# Patient Record
Sex: Male | Born: 2000 | Hispanic: No | Marital: Single | Smoking: Current every day smoker
Health system: Southern US, Community
[De-identification: ages and names within clinical notes are randomized; demographics above are authoritative.]

## PROBLEM LIST (undated history)

## (undated) DIAGNOSIS — R6250 Unspecified lack of expected normal physiological development in childhood: Secondary | ICD-10-CM

## (undated) DIAGNOSIS — R278 Other lack of coordination: Secondary | ICD-10-CM

## (undated) DIAGNOSIS — E119 Type 2 diabetes mellitus without complications: Secondary | ICD-10-CM

## (undated) DIAGNOSIS — Z9889 Other specified postprocedural states: Secondary | ICD-10-CM

## (undated) DIAGNOSIS — F840 Autistic disorder: Secondary | ICD-10-CM

## (undated) DIAGNOSIS — F909 Attention-deficit hyperactivity disorder, unspecified type: Secondary | ICD-10-CM

## (undated) DIAGNOSIS — F802 Mixed receptive-expressive language disorder: Secondary | ICD-10-CM

## (undated) HISTORY — DX: Type 2 diabetes mellitus without complications: E11.9

## (undated) HISTORY — DX: Autistic disorder: F84.0

## (undated) HISTORY — DX: Mixed receptive-expressive language disorder: F80.2

## (undated) HISTORY — DX: Other lack of coordination: R27.8

## (undated) HISTORY — PX: TONSILLECTOMY AND ADENOIDECTOMY: SUR1326

## (undated) HISTORY — PX: OTHER SURGICAL HISTORY: SHX169

## (undated) HISTORY — DX: Attention-deficit hyperactivity disorder, unspecified type: F90.9

## (undated) HISTORY — DX: Unspecified lack of expected normal physiological development in childhood: R62.50

## (undated) HISTORY — DX: Other specified postprocedural states: Z98.890

---
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
## 2021-02-03 ENCOUNTER — Encounter (HOSPITAL_COMMUNITY): Payer: Self-pay | Admitting: Physician Assistant

## 2021-03-19 ENCOUNTER — Ambulatory Visit (INDEPENDENT_AMBULATORY_CARE_PROVIDER_SITE_OTHER): Payer: Medicaid Other | Admitting: Physician Assistant

## 2021-03-19 ENCOUNTER — Encounter (HOSPITAL_COMMUNITY): Payer: Self-pay | Admitting: Physician Assistant

## 2021-03-19 ENCOUNTER — Other Ambulatory Visit: Payer: Self-pay

## 2021-03-19 VITALS — BP 112/71 | HR 75 | Ht 68.0 in | Wt 135.0 lb

## 2021-03-19 DIAGNOSIS — F909 Attention-deficit hyperactivity disorder, unspecified type: Secondary | ICD-10-CM

## 2021-03-19 DIAGNOSIS — F339 Major depressive disorder, recurrent, unspecified: Secondary | ICD-10-CM

## 2021-03-19 DIAGNOSIS — F840 Autistic disorder: Secondary | ICD-10-CM | POA: Diagnosis not present

## 2021-03-19 DIAGNOSIS — F411 Generalized anxiety disorder: Secondary | ICD-10-CM | POA: Diagnosis not present

## 2021-03-19 MED ORDER — ATOMOXETINE HCL 25 MG PO CAPS: 25.0000 mg | ORAL_CAPSULE | Freq: Every day | ORAL | 1 refills | Status: DC

## 2021-03-19 NOTE — Progress Notes (Addendum)
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  Patient is currently not taking any medications at this time.  Patient has been on numerous medications for the management of his mood swings and high levels of anxiety.  Although patient's speech is minimal at times, he is able to answer most questions directed towards him.  Patient states that due to his high levels of anxiety, he is constantly silent and depressed.  Patient endorses the following depressive symptoms: low mood, lack of interest in activities or hobbies, irritability, worthlessness, and hopelessness.  He denies instances of crying spells and exhaustion.  When patient was asked how he was doing, patient stated that knowing that he needs to survive in this fear of death keeps him going.  He reports that he is trying to figure out how to get through life and achieve his goals.  Patient states that he has aspirations to become a Marine scientist as well as learn how to play IT sales professional.  Patient states that he is trying to build strength and wants to learn how to not care about things while thinking tactically.  A current stressor in the patient's life involves managing his diabetes.  Patient states that it is hard for him to eat healthfully and often consumes chocolate to make himself feel better.  A PHQ-9 screen and GAD-7 screen were unable to be performed due to patient refusal.  Patient is alert and oriented x4, calm, cooperative, and engaged in conversation during  the encounter.  Patient reports that his body constantly switches from anxious to depressed depending on the situation.  Patient denies suicidal or homicidal ideations.  He further denies auditory or visual hallucinations and does not appear to be responding to internal/external stimuli.  Patient expresses that his sleep is fine.  Patient endorses fluctuating appetite due to his type 1 diabetes diagnosis.  Patient denies alcohol consumption, tobacco use, and illicit drug use.  Visit Diagnosis:    ICD-10-CM   1. Autism spectrum disorder  F84.0     2. Episode of recurrent major depressive disorder, unspecified depression episode severity (HCC)  F33.9     3. Anxiety state  F41.1     4. Attention deficit hyperactivity disorder (ADHD), unspecified ADHD type  F90.9 atomoxetine (STRATTERA) 25 MG capsule      Past Psychiatric History:  Anxiety Depression ADHD PDD NOS (Pervasive Developmental Disorder-Not Otherwise Specified) Autism spectrum disorder  Past Medical History:  Past Medical History:  Diagnosis Date   ADHD (attention deficit hyperactivity disorder)    Autism spectrum disorder    Developmental delay    Diabetes mellitus without complication (HCC)    Type I   Dysgraphia    History of tympanoplasty of right ear    [facility]   Receptive-expressive language delay    No past surgical history on file.  Family Psychiatric History:  Mother - ADD + auditory processing disorder. Per mother, bipolar disorder and depression run in her family. Father - Schizophrenia Uncle - Schizophrenia Grandmother (paternal) - Schizophrenia  Family History:  Family History  Problem Relation Age of  Onset   ADD / ADHD Mother    Crohn's disease Mother    Schizophrenia Father     Social History:  Social History   Socioeconomic History   Marital status: Single    Spouse name: Not on file   Number of children: Not on file   Years of education: Not on file   Highest education level: Not on  file  Occupational History   Not on file  Tobacco Use   Smoking status: Never   Smokeless tobacco: Never  Substance and Sexual Activity   Alcohol use: No   Drug use: No   Sexual activity: Never  Other Topics Concern   Not on file  Social History Narrative   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  Participates in taekwondo   Social Determinants of Health   Financial Resource Strain: Not on file  Food Insecurity: Not on file  Transportation Needs: Not on file  Physical Activity: Not on file  Stress: Not on file  Social Connections: Not on file    Allergies:  Allergies  Allergen Reactions   Amoxicillin Rash    Did it involve swelling of the face/tongue/throat, SOB, or low BP? No Did it involve sudden or severe rash/hives, skin peeling, or any reaction on the inside of your mouth or nose? No Did you need to seek medical attention at a hospital or doctor's office? Yes When did it last happen?     pt was a baby  If all above answers are "NO", may proceed with cephalosporin use.     Metabolic Disorder Labs: Lab Results  Component Value Date   HGBA1C 6.5 (H) 10/23/2019   MPG 140 10/23/2019   MPG 105 12/25/2015   No results found for: PROLACTIN No results found for: CHOL, TRIG, HDL, CHOLHDL, VLDL, LDLCALC Lab Results  Component Value Date   TSH 2.78 01/27/2017   TSH 5.04 (H) 01/06/2017    Therapeutic Level Labs: No results found for: LITHIUM No results found for: VALPROATE No components found for:  CBMZ  Current Medications: Current Outpatient Medications  Medication Sig Dispense Refill   atomoxetine (STRATTERA) 25 MG capsule Take 1 capsule (25 mg total) by mouth daily. 30 capsule 1   clindamycin (CLEOCIN) 150 MG capsule Take 1 capsule (150 mg total) by mouth 3 (three) times daily. 30 capsule 0   hydrOXYzine (ATARAX/VISTARIL) 25 MG tablet Take 0.5-1 tablets (12.5-25 mg total) by mouth every 8 (eight) hours as needed for itching. 30 tablet 0   insulin aspart (NOVOLOG) 100  UNIT/ML FlexPen Inject 1-8 Units into the skin 3 (three) times daily with meals. Divides carbs by 8 to get units     insulin glargine (LANTUS) 100 UNIT/ML Solostar Pen Inject 19 Units into the skin daily.     triamcinolone cream (KENALOG) 0.1 % Apply 1 application topically 2 (two) times daily. 30 g 0   No current facility-administered medications for this visit.     Musculoskeletal: Strength & Muscle Tone: within normal limits Gait & Station: normal Patient leans: N/A  Psychiatric Specialty Exam: Review of Systems  Psychiatric/Behavioral:  Positive for behavioral problems and decreased concentration. Negative for dysphoric mood, hallucinations, self-injury, sleep disturbance and suicidal ideas. The patient is nervous/anxious. The patient is not hyperactive.    Blood pressure 112/71, pulse 75, height 5\' 8"  (1.727 m), weight 135 lb (61.2 kg).Body mass index is 20.53 kg/m.  General Appearance: Bizarre and Well Groomed  Eye Contact:  Fair  Speech:  Clear and Coherent and Slow  Volume:  Normal  Mood:  Anxious, Depressed, and Worthless  Affect:  Congruent, Depressed, and Restricted  Thought Process:  Coherent, Goal Directed, and Descriptions of Associations: Intact  Orientation:  Full (Time, Place, and Person)  Thought Content: WDL and Obsessions   Suicidal Thoughts:  No  Homicidal Thoughts:  No  Memory:  Immediate;   Fair Recent;   Fair Remote;   Fair  Judgement:  Fair  Insight:  Present  Psychomotor Activity:  Decreased  Concentration:  Concentration: Fair and Attention Span: Fair  Recall:  [employer] of Knowledge: Fair  Language: Fair  Akathisia:  NA  Handed:  Left  AIMS (if indicated): not done  Assets:  Desire for Improvement Housing Social Support  ADL's:  Intact  Cognition: Impaired,  Mild  Sleep:  Fair   Screenings: AIMS    Flowsheet Row [facility]  AIMS Total Score 0      PHQ2-9     Flowsheet Row Office Visit from 08/26/2020 in [facility]  PHQ-2 Total Score 2  PHQ-9 Total Score 8      Flowsheet Row Office Visit from 03/19/2021 in [facility] ED from 12/13/2020 in [facility] Visit from 12/09/2020 in [facility]  C-SSRS RISK CATEGORY Low Risk No Risk Low Risk        Assessment and Plan:   Jeffrey Delgado is a 20 year old male with a past psychiatric history significant for anxiety, major depressive disorder, attention deficit hyperactivity disorder, and 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  Patient expresses that he is constantly dealing with anxety and depression.  It appears that patient's depression is influenced by his high levels of anxiety.  Patient states that although he is anxious at times, he states that his anxiety keeps him from being reckless.  Patient is interested in being placed on medication to help with his focus and concentration to be able to get his tasks done.  Patient was recommended Strattera 25 mg daily for the management of his lack of focus and concentration.  Patient was agreeable to recommendation.  Patient's medication to be prescribed to pharmacy of choice.  1. Autism spectrum disorder   2. Episode of recurrent major depressive disorder, unspecified depression episode severity (HCC)   3. Anxiety state   4. Attention deficit hyperactivity disorder (ADHD), unspecified ADHD type  - atomoxetine (STRATTERA) 25 MG capsule; Take 1 capsule (25 mg total) by mouth daily.  Dispense: 30 capsule; Refill: 1  Provider to follow up in 6 week Provider spent a total of 25 minutes with the patient/reviewing the patient's chart  Meta Hatchet, PA 03/19/2021, 2:02 PM

## 2021-05-13 ENCOUNTER — Encounter (HOSPITAL_COMMUNITY): Payer: Medicaid Other | Admitting: Physician Assistant

## 2021-05-19 ENCOUNTER — Ambulatory Visit: Payer: Medicaid Other | Admitting: Family Medicine

## 2021-05-25 ENCOUNTER — Encounter: Payer: Self-pay | Admitting: Family Medicine

## 2021-05-25 ENCOUNTER — Ambulatory Visit (INDEPENDENT_AMBULATORY_CARE_PROVIDER_SITE_OTHER): Payer: Medicaid Other | Admitting: Family Medicine

## 2021-05-25 ENCOUNTER — Other Ambulatory Visit: Payer: Self-pay

## 2021-05-25 VITALS — BP 132/78 | HR 67 | Resp 18 | Ht 68.0 in | Wt 131.0 lb

## 2021-05-25 DIAGNOSIS — E1069 Type 1 diabetes mellitus with other specified complication: Secondary | ICD-10-CM

## 2021-05-25 DIAGNOSIS — R634 Abnormal weight loss: Secondary | ICD-10-CM

## 2021-05-25 NOTE — Progress Notes (Signed)
Subjective:    Patient ID: Jeffrey Delgado, male    DOB: 2001-03-31, 20 y.o.   MRN: [id number]  HPI  Patient is a 20 year old gentleman who presents today for failure to gain weight.  He has a history of insulin-dependent type 1 diabetes mellitus.  He is currently on 18 units of Lantus daily and then rapid acting insulin 3 times a day depending upon his sugars.  He states that he has not seen endocrinologist in quite some time however he reports that his sugars are typically running in the 150 range.  Therefore hyperglycemia does not seem to be the cause of his weight loss.  He denies any polyuria or polydipsia.  The appointment was originally scheduled for bleeding per rectum however the patient adamantly denies that he is having any blood in his stool.  He states that his mother made that appointment and that she is "paranoid".  He again denies any blood in the stool.  He denies any abdominal pain.  He denies any diarrhea.  He denies any fevers or chills.  He denies any chest pain or cough or shortness of breath.  He denies any nausea or vomiting.  He states that the endocrinologist has not checked his thyroid because he has not seen them.  He denies any palpitations or tachycardia.  He denies any nausea or vomiting when eating gluten rich foods.  He denies any diarrhea when eating gluten rich foods. Past Medical History:  Diagnosis Date   ADHD (attention deficit hyperactivity disorder)    Autism spectrum disorder    Developmental delay    Diabetes mellitus without complication (HCC)    Type I   Dysgraphia    History of tympanoplasty of right ear    [facility]   Receptive-expressive language delay    History reviewed. No pertinent surgical history. Current Outpatient Medications on File Prior to Visit  Medication Sig Dispense Refill   Continuous Blood Gluc Sensor (DEXCOM G6 SENSOR) MISC SMARTSIG:1 Topical Every 10 Days     insulin aspart (NOVOLOG) 100 UNIT/ML FlexPen Inject 1-8 Units  into the skin 3 (three) times daily with meals. Divides carbs by 8 to get units     insulin glargine (LANTUS) 100 UNIT/ML Solostar Pen Inject 19 Units into the skin daily.     atomoxetine (STRATTERA) 25 MG capsule Take 1 capsule (25 mg total) by mouth daily. (Patient not taking: Reported on 05/25/2021) 30 capsule 1   hydrOXYzine (ATARAX/VISTARIL) 25 MG tablet Take 0.5-1 tablets (12.5-25 mg total) by mouth every 8 (eight) hours as needed for itching. (Patient not taking: Reported on 05/25/2021) 30 tablet 0   triamcinolone cream (KENALOG) 0.1 % Apply 1 application topically 2 (two) times daily. (Patient not taking: Reported on 05/25/2021) 30 g 0   No current facility-administered medications on file prior to visit.   Allergies  Allergen Reactions   Amoxicillin Rash    Did it involve swelling of the face/tongue/throat, SOB, or low BP? No Did it involve sudden or severe rash/hives, skin peeling, or any reaction on the inside of your mouth or nose? No Did you need to seek medical attention at a hospital or doctor's office? Yes When did it last happen?     pt was a baby  If all above answers are NO, may proceed with cephalosporin use.    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     Objective:   Physical Exam Constitutional:      General: He is not in acute distress.    Appearance: He is not ill-appearing or toxic-appearing.  Cardiovascular:     Rate and Rhythm: Normal rate and regular rhythm.     Pulses: Normal pulses.     Heart sounds: Normal heart sounds. No murmur heard.   No friction rub. No gallop.  Pulmonary:     Effort: Pulmonary effort is normal. No respiratory distress.     Breath sounds: Normal breath sounds. No stridor. No wheezing, rhonchi or rales.  Abdominal:     General: Abdomen is flat. Bowel sounds are normal. There is no distension.     Palpations: Abdomen is soft.     Tenderness: There is no abdominal tenderness. There is no guarding.  Musculoskeletal:     Right lower leg: No edema.     Left lower leg: No edema.  Neurological:     Mental Status: He is alert.  Psychiatric:        Attention and Perception: Attention normal.        Mood and Affect: Affect is flat.        Speech: Speech is delayed.        Behavior: Behavior normal.    Since I last saw the patient, he has several tattoos including a facial tattoo as well as a piercing above his left eyebrow.  He has also grown  mowhawk like hairstyle.  Include only for context about possible underlying illness.      Assessment & Plan:  Type 1 diabetes mellitus with other specified complication (HCC) - Plan: CBC with Differential/Platelet, COMPLETE METABOLIC PANEL WITH GFR, TSH, Hemoglobin A1c, Sedimentation rate, Celiac panel 10  Weight loss - Plan: CBC with Differential/Platelet, COMPLETE METABOLIC PANEL WITH GFR, TSH, Hemoglobin A1c, Sedimentation rate, Celiac panel 10 Given his history of type 1 diabetes mellitus, I feel that we need to check for hypothyroidism with a TSH.  I would also check a celiac panel to evaluate for celiac disease and check a sedimentation rate to evaluate for any  other autoimmune diseases although he denies any diarrhea or hematochezia.  Check a CBC and a CMP to look for any underlying metabolic abnormalities.  If these labs are normal, consult GI about possible pancreatic insufficiency given his underlying history of type 1 diabetes mellitus.  However may also be due to underlying emotional issues. Wt Readings from Last 3 Encounters:  05/25/21 131 lb (59.4 kg) (14 %, Z= -1.06)*  12/13/20 141 lb 12.8 oz (64.3 kg) (33 %, Z= -0.43)*  09/25/20 142 lb (64.4  kg) (35 %, Z= -0.38)*   * Growth percentiles are based on CDC (Boys, 2-20 Years) data.

## 2021-05-26 ENCOUNTER — Telehealth: Payer: Self-pay

## 2021-05-26 LAB — COMPLETE METABOLIC PANEL WITH GFR
AG Ratio: 1.7 (calc) (ref 1.0–2.5)
ALT: 11 U/L (ref 8–46)
AST: 13 U/L (ref 12–32)
Albumin: 4.6 g/dL (ref 3.6–5.1)
Alkaline phosphatase (APISO): 80 U/L (ref 46–169)
BUN: 16 mg/dL (ref 7–20)
CO2: 24 mmol/L (ref 20–32)
Calcium: 9.8 mg/dL (ref 8.9–10.4)
Chloride: 102 mmol/L (ref 98–110)
Creat: 0.78 mg/dL (ref 0.60–1.24)
Globulin: 2.7 g/dL (calc) (ref 2.1–3.5)
Glucose, Bld: 243 mg/dL — ABNORMAL HIGH (ref 65–99)
Potassium: 4.5 mmol/L (ref 3.8–5.1)
Sodium: 137 mmol/L (ref 135–146)
Total Bilirubin: 0.8 mg/dL (ref 0.2–1.1)
Total Protein: 7.3 g/dL (ref 6.3–8.2)
eGFR: 132 mL/min/{1.73_m2} (ref >=60)

## 2021-05-26 LAB — CBC WITH DIFFERENTIAL/PLATELET
Absolute Monocytes: 491 cells/uL (ref 200–950)
Basophils Absolute: 38 cells/uL (ref 0–200)
Basophils Relative: 0.6 %
Eosinophils Absolute: 50 cells/uL (ref 15–500)
Eosinophils Relative: 0.8 %
HCT: 50.3 % — ABNORMAL HIGH (ref 38.5–50.0)
Hemoglobin: 16.4 g/dL (ref 13.2–17.1)
Lymphs Abs: 1443 cells/uL (ref 850–3900)
MCH: 30.8 pg (ref 27.0–33.0)
MCHC: 32.6 g/dL (ref 32.0–36.0)
MCV: 94.4 fL (ref 80.0–100.0)
MPV: 11.7 fL (ref 7.5–12.5)
Monocytes Relative: 7.8 %
Neutro Abs: 4278 cells/uL (ref 1500–7800)
Neutrophils Relative %: 67.9 %
Platelets: 243 10*3/uL (ref 140–400)
RBC: 5.33 10*6/uL (ref 4.20–5.80)
RDW: 11.3 % (ref 11.0–15.0)
Total Lymphocyte: 22.9 %
WBC: 6.3 10*3/uL (ref 3.8–10.8)

## 2021-05-26 LAB — HEMOGLOBIN A1C
Hgb A1c MFr Bld: 6.1 % of total Hgb — ABNORMAL HIGH (ref <5.7)
Mean Plasma Glucose: 128 mg/dL
eAG (mmol/L): 7.1 mmol/L

## 2021-05-26 LAB — TSH: TSH: 3.94 mIU/L (ref 0.50–4.30)

## 2021-05-26 LAB — SEDIMENTATION RATE: Sed Rate: 2 mm/h (ref 0–15)

## 2021-05-26 NOTE — Telephone Encounter (Signed)
Attempted to reach patient but no answer and voicemail not set up.

## 2021-05-26 NOTE — Telephone Encounter (Signed)
-----   Message from Donita Brooks, MD sent at 05/26/2021  6:38 AM EST ----- Labs so far are normal.  Diabetes test actually looks pretty good.  Thyroid test is normal.  Sed rate is normal suggesting against inflammatory bowel disease.  Awaiting the results of the celiac panel.  If this is normal may consult GI regarding pancreatic insufficiency.

## 2021-06-01 ENCOUNTER — Encounter: Payer: Self-pay | Admitting: Family Medicine

## 2021-06-01 DIAGNOSIS — R899 Unspecified abnormal finding in specimens from other organs, systems and tissues: Secondary | ICD-10-CM

## 2021-06-05 ENCOUNTER — Encounter: Payer: Self-pay | Admitting: Gastroenterology

## 2021-06-09 ENCOUNTER — Ambulatory Visit (INDEPENDENT_AMBULATORY_CARE_PROVIDER_SITE_OTHER): Payer: Medicaid Other | Admitting: Physician Assistant

## 2021-06-09 ENCOUNTER — Encounter (HOSPITAL_COMMUNITY): Payer: Self-pay | Admitting: Physician Assistant

## 2021-06-09 VITALS — BP 127/72 | HR 70 | Ht 66.0 in | Wt 140.0 lb

## 2021-06-09 DIAGNOSIS — F411 Generalized anxiety disorder: Secondary | ICD-10-CM

## 2021-06-09 DIAGNOSIS — F840 Autistic disorder: Secondary | ICD-10-CM | POA: Diagnosis not present

## 2021-06-09 DIAGNOSIS — F339 Major depressive disorder, recurrent, unspecified: Secondary | ICD-10-CM | POA: Diagnosis not present

## 2021-06-09 DIAGNOSIS — F909 Attention-deficit hyperactivity disorder, unspecified type: Secondary | ICD-10-CM | POA: Diagnosis not present

## 2021-06-09 NOTE — Progress Notes (Signed)
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  Following the conclusion of his last encounter, patient was placed on Strattera 25 mg daily for the management of his lack of focus and concentration.   Patient reports that he is no longer taking his Strattera due to experiencing heavy side effects.  The main side effect that the patient experienced while on Strattera was drowsiness and states that he was unable to complete his work due to his sleepiness.  Patient is unable to accurately define his current mood but was able to endorse depression and anxiety.  He reports that his emotions are repressed and is unsure of he is actively repressing his own emotions.  He later adds that he is unsure if it is a good idea for his emotions to be repressed.  Patient is unable to scale/grade his anxiety at this time.  When asked about stressors, patient responded with "I wouldn't know."  He does express that he experiences agitation due to constantly realizing that he has one life to live.  He adds that his agitation is attributed to the need to enhance his body and make it more superior.  He states that he does not want to be inferior and hates the flaws in his genetics.  He reports despising people who live in their lives in circles doing the same routine over and over.  He reports wanting to live in a world of fantasy such as worlds he visits in his sleep.  Patient is unable to complete a PHQ-9 screen or a GAD-7 screen due to being unable to accurately grade his symptoms.  Patient  is calm, cooperative, and engaged in conversation during the encounter.  Patient continues to endorse that he is unsure of his mood.  Patient denies suicidal or homicidal ideations.  He further denies auditory or visual hallucinations and does not appear to be responding to internal/external stimuli.  Although he does appear to be exhibiting some imaginative/colorful thought content, he does not exhibit any bizarre behavior.  Patient endorses fair sleep receiving anywhere between 6 and 12 hours.  He states that he occasionally engages in micro naps receives between 1 to 2 hours of sleep at a time.  Patient endorses fair appetite stating that he is currently participating in a carnivore diet.  Patient states that he does meal prep and will drink protein shakes routinely.  Patient denies alcohol consumption, tobacco use, and illicit drug use.  Visit Diagnosis:    ICD-10-CM   1. Autism spectrum disorder  F84.0     2. Episode of recurrent major depressive disorder, unspecified depression episode severity ([place])  F33.9     3. Anxiety state  F41.1     4. Attention deficit hyperactivity disorder (ADHD), unspecified ADHD type  F90.9       Past Psychiatric History:  Anxiety Depression ADHD PDD NOS (Pervasive Developmental Disorder-Not Otherwise Specified) Autism spectrum disorder  Past Medical History:  Past Medical History:  Diagnosis Date   ADHD (attention deficit hyperactivity disorder)    Autism spectrum disorder    Developmental delay    Diabetes mellitus without complication ([place])  Type I   Dysgraphia    History of tympanoplasty of right ear    [facility]   Receptive-expressive language delay    History reviewed. No pertinent surgical history.  Family Psychiatric History:  Mother - ADD + auditory processing disorder. Per mother, bipolar disorder and depression run in her family. Father - Schizophrenia Uncle - Schizophrenia Grandmother (paternal) - Schizophrenia  Family  History:  Family History  Problem Relation Age of Onset   ADD / ADHD Mother    Crohn's disease Mother    Schizophrenia Father     Social History:  Social History   Socioeconomic History   Marital status: Single    Spouse name: Not on file   Number of children: Not on file   Years of education: Not on file   Highest education level: Not on file  Occupational History   Not on file  Tobacco Use   Smoking status: Never   Smokeless tobacco: Never  Substance and Sexual Activity   Alcohol use: No   Drug use: No   Sexual activity: Never  Other Topics Concern   Not on file  Social History Narrative   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 No Did it involve sudden or severe rash/hives, skin peeling, or any reaction on the inside of your mouth or nose? No Did you need to seek medical attention at a hospital or doctor's office? Yes When did it last happen?     pt was a baby  If all above answers are NO, may proceed with cephalosporin use.     Metabolic Disorder Labs: Lab Results  Component Value Date   HGBA1C 6.1 (H) 05/25/2021   MPG 128 05/25/2021   MPG 140 10/23/2019   No results found for: PROLACTIN No results found for: CHOL, TRIG, HDL, CHOLHDL, VLDL, LDLCALC Lab Results  Component Value Date   TSH 3.94 05/25/2021   TSH 2.78 01/27/2017    Therapeutic Level Labs: No results found for: LITHIUM No results found for: VALPROATE No components found for:  CBMZ  Current Medications: Current Outpatient Medications  Medication Sig Dispense Refill   atomoxetine (STRATTERA) 25 MG capsule Take  1 capsule (25 mg total) by mouth daily. (Patient not taking: Reported on 05/25/2021) 30 capsule 1   Continuous Blood Gluc Sensor (DEXCOM G6 SENSOR) MISC SMARTSIG:1 Topical Every 10 Days     hydrOXYzine (ATARAX/VISTARIL) 25 MG tablet Take 0.5-1 tablets (12.5-25 mg total) by mouth every 8 (eight) hours as needed for itching. (Patient not taking: Reported on 05/25/2021) 30 tablet 0   insulin aspart (NOVOLOG) 100 UNIT/ML FlexPen Inject 1-8 Units into the skin 3 (three) times daily with meals. Divides carbs by 8 to get units     insulin glargine (LANTUS) 100 UNIT/ML Solostar Pen Inject 19 Units into the skin daily.     triamcinolone cream (KENALOG) 0.1 % Apply 1 application topically 2 (two) times daily. (Patient not taking: Reported on 05/25/2021) 30 g 0   No current facility-administered medications for this visit.     Musculoskeletal: Strength & Muscle Tone: within normal  limits Gait & Station: normal Patient leans: N/A  Psychiatric Specialty Exam: Review of Systems  Psychiatric/Behavioral:  Positive for behavioral problems, decreased concentration, dysphoric mood and sleep disturbance. Negative for hallucinations, self-injury and suicidal ideas. The patient is nervous/anxious. The patient is not hyperactive.    Blood pressure 127/72, pulse 70, height 5\' 6"  (1.676 m), weight 140 lb (63.5 kg), SpO2 98 %.Body mass index is 22.6 kg/m.  General Appearance: Bizarre and Casual  Eye Contact:  Fair  Speech:  Clear and Coherent and Slow  Volume:  Normal  Mood:  Anxious, Depressed, and Worthless  Affect:  Congruent, Depressed, and Restricted  Thought Process:  Coherent and Descriptions of Associations: Intact  Orientation:  Full (Time, Place, and Person)  Thought Content: WDL and Obsessions   Suicidal Thoughts:  No  Homicidal Thoughts:  No  Memory:  Immediate;   Fair Recent;   Fair Remote;   Fair  Judgement:  Fair  Insight:  Present  Psychomotor Activity:  Decreased  Concentration:   Concentration: Fair and Attention Span: Fair  Recall:  [employer] of Knowledge: Fair  Language: Fair  Akathisia:  NA  Handed:  Left  AIMS (if indicated): not done  Assets:  Communication Skills Desire for Improvement Social Support  ADL's:  Intact  Cognition: Impaired,  Mild  Sleep:  Fair   Screenings: AIMS    Flowsheet Row Admission (Discharged) from 10/22/2019 in [place] [facility]  AIMS Total Score 0      PHQ2-9    [place] Office Visit from 08/26/2020 in [place]  PHQ-2 Total Score 2  PHQ-9 Total Score 8      [place] Visit from 06/09/2021 in 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  Patient discontinued taking his Strattera due to experiencing side effects from the medication.  Patient was recommended Genesight testing determine what medications may be helpful for his symptoms based off his genetics.  Patient was agreeable to participating in testing.  Provider informed patient that his results will be discussed with him following his next appointment.  1. Autism spectrum disorder   2. Episode of recurrent major depressive disorder, unspecified depression episode severity ([place])   3. Anxiety state   4. Attention deficit hyperactivity disorder (ADHD), unspecified ADHD type  Patient to follow up in 2 months Provider spent a total of 21 minutes with the  patient/reviewing patient's chart   Malachy Mood, PA 06/09/2021, 6:37 PM

## 2021-06-10 ENCOUNTER — Ambulatory Visit: Payer: Medicaid Other | Admitting: Podiatry

## 2021-06-17 ENCOUNTER — Other Ambulatory Visit (HOSPITAL_COMMUNITY): Payer: Self-pay | Admitting: Physician Assistant

## 2021-06-17 DIAGNOSIS — F909 Attention-deficit hyperactivity disorder, unspecified type: Secondary | ICD-10-CM

## 2021-06-29 ENCOUNTER — Encounter: Payer: Self-pay | Admitting: Gastroenterology

## 2021-06-29 ENCOUNTER — Other Ambulatory Visit: Payer: Medicaid Other

## 2021-06-29 ENCOUNTER — Ambulatory Visit (INDEPENDENT_AMBULATORY_CARE_PROVIDER_SITE_OTHER): Payer: Medicaid Other | Admitting: Gastroenterology

## 2021-06-29 VITALS — BP 110/80 | HR 90 | Ht 66.0 in | Wt 136.0 lb

## 2021-06-29 DIAGNOSIS — R634 Abnormal weight loss: Secondary | ICD-10-CM | POA: Diagnosis not present

## 2021-06-29 DIAGNOSIS — R197 Diarrhea, unspecified: Secondary | ICD-10-CM | POA: Diagnosis not present

## 2021-06-29 NOTE — Patient Instructions (Signed)
If you are age 20 or older, your body mass index should be between 23-30. Your Body mass index is 21.95 kg/m. If this is out of the aforementioned range listed, please consider follow up with your Primary Care Provider.  If you are age 23 or younger, your body mass index should be between 19-25. Your Body mass index is 21.95 kg/m. If this is out of the aformentioned range listed, please consider follow up with your Primary Care Provider.   ________________________________________________________  The [facility] providers would like to encourage you to use [facility] to communicate with providers for non-urgent requests or questions.  Due to long hold times on the telephone, sending your provider a message by [facility] may be a faster and more efficient way to get a response.  Please allow 48 business hours for a response.  Please remember that this is for non-urgent requests.  _______________________________________________________  Your provider has requested that you go to the basement level for lab work before leaving today. Press "B" on the elevator. The lab is located at the first door on the left as you exit the elevator.  Due to recent changes in healthcare laws, you may see the results of your imaging and laboratory studies on MyChart before your provider has had a chance to review them.  We understand that in some cases there may be results that are confusing or concerning to you. Not all laboratory results come back in the same time frame and the provider may be waiting for multiple results in order to interpret others.  Please give [place] 48 hours in order for your provider to thoroughly review all the results before contacting the office for clarification of your results.   It was a pleasure to see you today!  Thank you for trusting me with your gastrointestinal care!

## 2021-06-29 NOTE — Progress Notes (Signed)
[facility] Consult Note:  History: Jeffrey Delgado 06/29/2021  Referring provider: Susy Frizzle, MD  Reason for consult/chief complaint: abnormal labs (Can't put weight on, labs done thru PCP, diabetic)   Subjective  HPI: Jeffrey Delgado was here with his mother today and referred by primary care for weight loss.  Notes from recent primary care visit referenced below. It was difficult to get a clear history due to his communication disorder.  As near as I can determine, there has been occasional abdominal pain, but cannot characterize it any better.  When asked if diarrhea, he first answered no, then said there is occasional loose stool including an episode this morning and perhaps one a few days ago.  He denies blood in the stool, nausea, vomiting or early satiety.  He reports a good appetite but cannot put weight on.  It does not sound like he has any abdominal pain while eating.  There is documented weight loss in PCP records.  Numerous labs done at recent primary care visit, intention was also for celiac labs but they either were not drawn or there was a problem processing them at the lab so no results. Mother reports that she has UC or Crohn's.  Unfortunately, she has not been able to get a clear story from [place] at home from any digestive symptoms he might be having.  (10 min late to appointment) ROS:  Review of Systems  Constitutional:  Negative for appetite change and unexpected weight change.  HENT:  Negative for mouth sores and voice change.   Eyes:  Negative for pain and redness.  Respiratory:  Negative for cough and shortness of breath.   Cardiovascular:  Negative for chest pain and palpitations.  Genitourinary:  Negative for dysuria and hematuria.  Musculoskeletal:  Negative for arthralgias and myalgias.  Skin:  Negative for pallor and rash.  Neurological:  Negative for weakness and headaches.  Hematological:  Negative for adenopathy.    Past  Medical History: Past Medical History:  Diagnosis Date   ADHD (attention deficit hyperactivity disorder)    Autism spectrum disorder    Developmental delay    Diabetes mellitus without complication ([place])    Type I   Dysgraphia    History of tympanoplasty of right ear    [facility]   Receptive-expressive language delay    From 05/25/2021 primary care office note: "Patient is a 21 year old gentleman who presents today for failure to gain weight.  He has a history of insulin-dependent type 1 diabetes mellitus.  He is currently on 18 units of Lantus daily and then rapid acting insulin 3 times a day depending upon his sugars.  He states that he has not seen endocrinologist in quite some time however he reports that his sugars are typically running in the 150 range.  Therefore hyperglycemia does not seem to be the cause of his weight loss.  He denies any polyuria or polydipsia.  The appointment was originally scheduled for bleeding per rectum however the patient adamantly denies that he is having any blood in his stool.  He states that his mother made that appointment and that she is "paranoid".  He again denies any blood in the stool.  He denies any abdominal pain.  He denies any diarrhea.  He denies any fevers or chills.  He denies any chest pain or cough or shortness of breath.  He denies any nausea or vomiting.  He states that the endocrinologist has not checked his thyroid because he  has not seen them.  He denies any palpitations or tachycardia.  He denies any nausea or vomiting when eating gluten rich foods.  He denies any diarrhea when eating gluten rich foods. "   Past Surgical History: Past Surgical History:  Procedure Laterality Date   ingrown toenails Bilateral    TONSILLECTOMY AND ADENOIDECTOMY     tubes in ear     and another ear surgery to fix the hole where the tubes fell out     Family History: Family History  Problem Relation Age of Onset   ADD / ADHD Mother    Crohn's  disease Mother    Schizophrenia Father     Social History: Social History   Socioeconomic History   Marital status: Single    Spouse name: Not on file   Number of children: Not on file   Years of education: Not on file   Highest education level: Not on file  Occupational History   Not on file  Tobacco Use   Smoking status: Never   Smokeless tobacco: Never  Vaping Use   Vaping Use: Never used  Substance and Sexual Activity   Alcohol use: No   Drug use: No   Sexual activity: Never  Other Topics Concern   Not on file  Social History Narrative   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 No Did it involve sudden or severe rash/hives, skin peeling, or any reaction on the inside of your mouth or nose? No Did you need to seek medical attention at a hospital or doctor's office? Yes When did it last happen?     pt was a baby  If all above answers are NO, may proceed with cephalosporin use.     Outpatient Meds: Current Outpatient Medications  Medication Sig Dispense Refill   Continuous Blood Gluc Sensor (DEXCOM G6 SENSOR) MISC SMARTSIG:1 Topical Every 10 Days     hydrOXYzine (ATARAX/VISTARIL) 25 MG tablet Take 0.5-1 tablets (12.5-25 mg total) by mouth every 8 (eight) hours as needed for itching. 30 tablet 0   insulin aspart (NOVOLOG) 100 UNIT/ML FlexPen Inject 1-8 Units into the skin 3 (three) times daily with meals. Divides carbs by 8 to get units     insulin glargine (LANTUS) 100 UNIT/ML Solostar Pen Inject 19 Units into the skin daily.     triamcinolone cream (KENALOG) 0.1 % Apply 1 application  topically 2 (two) times daily. 30 g 0   atomoxetine (STRATTERA) 25 MG capsule Take 1 capsule (25 mg total) by mouth daily. (Patient not taking: Reported on 05/25/2021) 30 capsule 1   No current facility-administered medications for this visit.      ___________________________________________________________________ Objective   Exam:  BP 110/80    Pulse 90    Ht 5' 6"  (1.676 m)    Wt 136 lb (61.7 kg)    BMI 21.95 kg/m  Wt Readings from Last 3 Encounters:  06/29/21 136 lb (61.7 kg) (21 %, Z= -0.81)*  05/25/21 131 lb (59.4 kg) (14 %, Z= -1.06)*  12/13/20 141 lb 12.8 oz (64.3 kg) (33 %, Z= -0.43)*   *  Growth percentiles are based on CDC (Boys, 2-20 Years) data.    General: Restricted affect, minimal conversationalist. Eyes: sclera anicteric, no redness ENT: oral mucosa moist without lesions, no cervical or supraclavicular or axillary lymphadenopathy.  No muscle wasting CV: RRR without murmur, S1/S2, no JVD, no peripheral edema Resp: clear to auscultation bilaterally, normal RR and effort noted GI: soft, no tenderness, with active bowel sounds. No guarding or palpable organomegaly noted. Skin; warm and dry, no rash or jaundice noted Neuro: awake, alert and oriented x 3. Normal gross motor function and fluent speech  Labs:  CBC Latest Ref Rng & Units 05/25/2021 10/20/2019 12/30/2018  WBC 3.8 - 10.8 Thousand/uL 6.3 5.9 5.9  Hemoglobin 13.2 - 17.1 g/dL 16.4 15.0 15.3  Hematocrit 38.5 - 50.0 % 50.3(H) 44.7 44.5  Platelets 140 - 400 Thousand/uL 243 215 183   CMP Latest Ref Rng & Units 05/25/2021 10/20/2019 12/30/2018  Glucose 65 - 99 mg/dL 243(H) 201(H) 236(H)  BUN 7 - 20 mg/dL 16 10 10   Creatinine 0.60 - 1.24 mg/dL 0.78 0.66 1.00  Sodium 135 - 146 mmol/L 137 134(L) 138  Potassium 3.8 - 5.1 mmol/L 4.5 4.0 4.1  Chloride 98 - 110 mmol/L 102 102 104  CO2 20 - 32 mmol/L 24 24 23   Calcium 8.9 - 10.4 mg/dL 9.8 9.1 9.6  Total Protein 6.3 - 8.2 g/dL 7.3 6.7 6.8  Total Bilirubin 0.2 - 1.1  mg/dL 0.8 0.8 1.2  Alkaline Phos 52 - 171 U/L - 106 107  AST 12 - 32 U/L 13 16 18   ALT 8 - 46 U/L 11 13 17    ESR = 2  TSH normal at 3.94  Hemoglobin A1c 6.1  Radiologic Studies:  None for review  Assessment: Encounter Diagnoses  Name Primary?   Weight loss Yes   Diarrhea, unspecified type     Weight loss, PCP concerned about celiac or EPI.  Limited history, but does not sound consistent with EPI (perhaps a few loose stools in the last few weeks), no reported rectal bleeding.  Hemoglobin A1P good, though certainly some recent high glucose values, which seems likely contributing to weight loss.  Plan:  Celiac lab Fecal elastase if loose stool  If celiac antibodies positive and/or if he continues to lose weight without clear explanation, consider upper endoscopy and colonoscopy.  I briefly explained those types of procedures, and while his mother is familiar with that, it was difficult to determine [place]'s thoughts about it.  Thank you for the courtesy of this consult.  Please call me with any questions or concerns.  Nelida Meuse III  CC: Referring provider noted above

## 2021-06-30 LAB — IGA: Immunoglobulin A: 109 mg/dL (ref 47–310)

## 2021-06-30 LAB — TISSUE TRANSGLUTAMINASE, IGA: (tTG) Ab, IgA: <1 U/mL

## 2021-07-23 ENCOUNTER — Other Ambulatory Visit: Payer: Self-pay

## 2021-07-23 ENCOUNTER — Encounter (HOSPITAL_COMMUNITY): Payer: Self-pay | Admitting: Physician Assistant

## 2021-07-23 ENCOUNTER — Ambulatory Visit (HOSPITAL_COMMUNITY): Payer: Medicaid Other | Admitting: Physician Assistant

## 2021-07-23 VITALS — BP 130/66 | HR 57 | Ht 66.0 in | Wt 143.0 lb

## 2021-07-23 DIAGNOSIS — F909 Attention-deficit hyperactivity disorder, unspecified type: Secondary | ICD-10-CM

## 2021-07-23 DIAGNOSIS — F339 Major depressive disorder, recurrent, unspecified: Secondary | ICD-10-CM

## 2021-07-23 DIAGNOSIS — F840 Autistic disorder: Secondary | ICD-10-CM

## 2021-07-23 DIAGNOSIS — F411 Generalized anxiety disorder: Secondary | ICD-10-CM

## 2021-07-23 NOTE — Progress Notes (Signed)
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  During the last encounter, patient was not placed on any medications due to wanting to wait for GeneSight testing results.  When asked how he was doing, patient reports "I'm alive, still alive."  Patient reports that he does not think he has been experiencing depression as of late.  He states that his body occasionally experiences anxiety; however, his body tends to repress it.  He reports that he tends to experience anxiety when thinking about future plans such as job prospects or moving out.  Patient denies any new stressors but states that he has fears of dying and what comes at the end of life.  He reports that he wishes to be more productive but he does not feel that it is possible for him at the moment.  When asked about mood swings, patient states that he experiences changes in his personality when struggling to communicate with people.  He states that he occasionally feels sociopathic towards people; however, he does note that he also wants to learn to talk to people.  Patient states that he still has trouble with giving out accurate information and adapting to people. Patient is unable to complete a PHQ-9 screen or a GAD-7 screen due to being unable to accurately grade his symptoms.  Patient is calm, cooperative, and engaged in conversation during the encounter.  Patient describes his mood as being average.  Patient denies suicidal or homicidal ideation.  He  further denies auditory or visual hallucinations and does not appear to be responding to internal/external stimuli.  Patient states that the amount of sleep she receives is unpredictable.  He states that he either receives microsleep or sleeps for a long time.  Patient states that he eats as much as he can averaging about 3 meals per day.  He states that his body never goes above 138 pounds or falls below 131 pounds.  Patient denies alcohol consumption, tobacco use, and illicit drug use.  Visit Diagnosis:    ICD-10-CM   1. Autism spectrum disorder  F84.0     2. Episode of recurrent major depressive disorder, unspecified depression episode severity ([place])  F33.9     3. Anxiety state  F41.1     4. Attention deficit hyperactivity disorder (ADHD), unspecified ADHD type  F90.9       Past Psychiatric History:  Anxiety Depression ADHD PDD NOS (Pervasive Developmental Disorder-Not Otherwise Specified) Autism spectrum disorder  Past Medical History:  Past Medical History:  Diagnosis Date   ADHD (attention deficit hyperactivity disorder)    Autism spectrum disorder    Developmental delay    Diabetes mellitus without complication ([place])    Type I   Dysgraphia    History of tympanoplasty of right ear    [facility] 2015   Receptive-expressive language delay     Past Surgical History:  Procedure Laterality Date   ingrown toenails Bilateral    TONSILLECTOMY AND ADENOIDECTOMY     tubes in ear     and another ear surgery to  fix the hole where the tubes fell out    Family Psychiatric History:  Mother - ADD + auditory processing disorder. Per mother, bipolar disorder and depression run in her family. Father - Schizophrenia Uncle - Schizophrenia Grandmother (paternal) - Schizophrenia  Family History:  Family History  Problem Relation Age of Onset   ADD / ADHD Mother    Crohn's disease Mother    Schizophrenia Father     Social History:  Social History   Socioeconomic History    Marital status: Single    Spouse name: Not on file   Number of children: Not on file   Years of education: Not on file   Highest education level: Not on file  Occupational History   Not on file  Tobacco Use   Smoking status: Never   Smokeless tobacco: Never  Vaping Use   Vaping Use: Never used  Substance and Sexual Activity   Alcohol use: No   Drug use: No   Sexual activity: Never  Other Topics Concern   Not on file  Social History Narrative   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 No Did it involve sudden or severe rash/hives, skin peeling, or any reaction on the inside of your mouth or nose? No Did you need to seek medical attention at a hospital or doctor's office? Yes When did it last happen?     pt was a baby  If all above answers are NO, may proceed with cephalosporin use.     Metabolic Disorder Labs: Lab Results  Component Value Date   HGBA1C 6.1 (H) 05/25/2021   MPG 128 05/25/2021   MPG 140 10/23/2019   No results found for: PROLACTIN No results found for: CHOL, TRIG, HDL, CHOLHDL, VLDL, LDLCALC Lab Results  Component Value Date   TSH 3.94 05/25/2021   TSH 2.78 01/27/2017    Therapeutic Level Labs: No results found for: LITHIUM No results found for: VALPROATE No components found for:  CBMZ  Current Medications: Current Outpatient Medications  Medication Sig Dispense Refill   atomoxetine (STRATTERA) 25 MG capsule Take 1 capsule (25 mg total) by mouth daily. 30 capsule 1   Continuous Blood Gluc Sensor (DEXCOM G6 SENSOR) MISC SMARTSIG:1 Topical Every 10 Days     hydrOXYzine  (ATARAX/VISTARIL) 25 MG tablet Take 0.5-1 tablets (12.5-25 mg total) by mouth every 8 (eight) hours as needed for itching. 30 tablet 0   insulin aspart (NOVOLOG) 100 UNIT/ML FlexPen Inject 1-8 Units into the skin 3 (three) times daily with meals. Divides carbs by 8 to get units     insulin glargine (LANTUS) 100 UNIT/ML Solostar Pen Inject 19 Units into the skin daily.     triamcinolone cream (KENALOG) 0.1 % Apply 1 application topically 2 (two) times daily. 30 g 0   No current facility-administered medications for this visit.     Musculoskeletal: Strength & Muscle Tone: within normal limits Gait & Station: normal Patient leans: N/A  Psychiatric Specialty Exam: Review of Systems  Psychiatric/Behavioral:  Positive for behavioral problems, decreased concentration and sleep disturbance. Negative for dysphoric mood, hallucinations,  self-injury and suicidal ideas. The patient is nervous/anxious. The patient is not hyperactive.    Blood pressure 130/66, pulse (!) 57, height 5\' 6"  (1.676 m), weight 143 lb (64.9 kg).Body mass index is 23.08 kg/m.  General Appearance: Bizarre and Casual  Eye Contact:  Fair  Speech:  Clear and Coherent and Slow  Volume:  Normal  Mood:  Anxious, Depressed, and Worthless  Affect:  Congruent and Restricted  Thought Process:  Coherent and Descriptions of Associations: Intact  Orientation:  Full (Time, Place, and Person)  Thought Content: WDL and Obsessions   Suicidal Thoughts:  No  Homicidal Thoughts:  No  Memory:  Immediate;   Fair Recent;   Fair Remote;   Fair  Judgement:  Fair  Insight:  Present  Psychomotor Activity:  Decreased  Concentration:  Concentration: Fair and Attention Span: Fair  Recall:  [employer] of Knowledge: Fair  Language: Fair  Akathisia:  No  Handed:  Left  AIMS (if indicated): not done  Assets:  Communication Skills Desire for Improvement Social Support  ADL's:  Intact  Cognition: Impaired,  Mild  Sleep:  Fair    Screenings: AIMS    Flowsheet Row Admission (Discharged) from 10/22/2019 in [place] [facility]  AIMS Total Score 0      PHQ2-9    [place] Visit from 08/26/2020 in [facility]  PHQ-2 Total Score 2  PHQ-9 Total Score 8      [place] from 07/23/2021 in [facility] Office Visit from 06/09/2021 in [facility] Office Visit from 03/19/2021 in [place] No Risk Low Risk Low Risk        Assessment and Plan:   Hykeem A. 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  Patient denies depressive symptoms but states that he does endorse anxiety when thinking about future plans.GeneSight testing results were discussed with patient but upon review of patient's results, patient refused medication management.  Patient informed provider that he would like to discuss options with his mother before making a decision.  Patient's results to be scanned into patient's  Epic account so he can discuss medication results with his mother.  Patient to be reassessed on his subsequent encounter.  Collaboration of Care: Collaboration of Care: Psychiatrist AEB patient continuing to seek services through our facility.  Patient/Guardian was advised Release of Information must be obtained prior to any record release in order to collaborate their care with an outside provider. Patient/Guardian was advised if they have not already done so to contact the registration department to sign all necessary forms in order for [place] to release information regarding their  care.   Consent: Patient/Guardian gives verbal consent for treatment and assignment of benefits for services provided during this visit. Patient/Guardian expressed understanding and agreed to proceed.   1. Autism spectrum disorder   2. Episode of recurrent major depressive disorder, unspecified depression episode severity ([place])   3. Anxiety state   4. Attention deficit hyperactivity disorder (ADHD), unspecified ADHD type  Patient to follow up in 6 weeks Provider spent a total of 22 minutes with the patient/reviewing patient's chart  Malachy Mood, PA 07/23/2021, 8:36 PM

## 2021-08-27 ENCOUNTER — Ambulatory Visit (INDEPENDENT_AMBULATORY_CARE_PROVIDER_SITE_OTHER): Payer: Medicaid Other | Admitting: Physician Assistant

## 2021-08-27 ENCOUNTER — Other Ambulatory Visit: Payer: Self-pay

## 2021-08-27 ENCOUNTER — Encounter (HOSPITAL_COMMUNITY): Payer: Self-pay | Admitting: Physician Assistant

## 2021-08-27 VITALS — BP 145/60 | HR 77 | Ht 66.0 in | Wt 142.0 lb

## 2021-08-27 DIAGNOSIS — F840 Autistic disorder: Secondary | ICD-10-CM

## 2021-08-27 DIAGNOSIS — F411 Generalized anxiety disorder: Secondary | ICD-10-CM

## 2021-08-27 DIAGNOSIS — F339 Major depressive disorder, recurrent, unspecified: Secondary | ICD-10-CM

## 2021-08-27 DIAGNOSIS — F909 Attention-deficit hyperactivity disorder, unspecified type: Secondary | ICD-10-CM

## 2021-08-27 NOTE — Progress Notes (Addendum)
[facility] MD/PA/NP OP Progress Note ? ?08/27/2021 3:05 PM ?[name]  ?MRN:  [id number] ? ?Chief Complaint:  ?Chief Complaint  ?Patient presents with  ? Medication Management  ?  F/U MM  ? ?HPI:  ? 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  During the last encounter, patient refused medication management. ? ?Patient reports that his life has been meaningless, mundane, and boring.  He reports feeling like he is caught in a loop and not making any progress.  He reports fear of not getting satisfaction from anything and would like to focus on getting results.  Patient denies wanting to talk about or discussed the type of results that he wants to see.  Patient endorses depressive symptoms he attributes to not seeing anything in life.  He states that he also experiences depression when being provided with them from those that do not appear wise due to past choices they have made and their current standing.  Patient denies anxiety stating that his emotions suppress his anxiety. ? ?Patient endorses the following depressive symptoms: feelings of sadness and lack of motivation.  Patient reports that she wants to be more efficient and is interested in transhumanism.  He reports that he wants to figure out how to make a lot of cash and is interested in combat duty.  He is also interested in working in field work and recovery.  Patient is currently a Financial controller at [employer].  Patient is unable to complete a PHQ-9 or GAD-7 screen due to being unable to accurately grade his symptoms. ? ?Patient is alert and oriented x4, calm, cooperative, and engaged in conversation during the encounter.  Patient endorses neutral mood.  Patient  denies suicidal or homicidal ideations and does not appear to be responding to internal/external stimuli.  Patient endorses fluctuating sleep.  Patient endorses decreased appetite and eats on average 1-2 meals per day.  Patient denies alcohol consumption, tobacco use, and illicit drug use. ? ?Visit Diagnosis:  ?  ICD-10-CM   ?1. Autism spectrum disorder  F84.0   ?  ?2. Anxiety state  F41.1   ?  ?3. Episode of recurrent major depressive disorder, unspecified depression episode severity ([facility])  F33.9   ?  ?4. Attention deficit hyperactivity disorder (ADHD), unspecified ADHD type  F90.9   ?  ? ? ?Past Psychiatric History:  ?Anxiety ?Depression ?ADHD ?PDD NOS (Pervasive Developmental Disorder-Not Otherwise Specified) ?Autism spectrum disorder ? ?Past Medical History:  ?Past Medical History:  ?Diagnosis Date  ? ADHD (attention deficit hyperactivity disorder)   ? Autism spectrum disorder   ? Developmental delay   ? Diabetes mellitus without complication (HCC)   ? Type I  ? Dysgraphia   ? History of tympanoplasty of right ear   ? [facility] 2015  ? Receptive-expressive language delay   ?  ?Past Surgical History:  ?Procedure Laterality Date  ? ingrown toenails Bilateral   ? TONSILLECTOMY AND ADENOIDECTOMY    ? tubes in ear    ? and another ear surgery to fix the hole where the tubes fell out  ? ? ?Family Psychiatric History:  ?Mother - ADD + auditory processing disorder. Per mother, bipolar disorder and depression run in her family. ?Father - Schizophrenia ?Uncle - Schizophrenia ?Grandmother (paternal) - Schizophrenia ? ?Family History:  ?Family  History  ?Problem Relation Age of Onset  ? ADD / ADHD Mother   ? Crohn's disease Mother   ? Schizophrenia Father   ? ? ?Social History:  ?Social History  ? ?Socioeconomic History  ? Marital status: Single  ?  Spouse name: Not on file  ? Number of children: Not on file  ? Years of education: Not on file  ? Highest education level: Not on file  ?Occupational History  ? Not on file   ?Tobacco Use  ? Smoking status: Never  ? Smokeless tobacco: Never  ?Vaping Use  ? Vaping Use: Never used  ?Substance and Sexual Activity  ? Alcohol use: No  ? Drug use: No  ? Sexual activity: Never  ?Other Topics Concern  ? Not on file  ?Social History Narrative  ? Attends [employer].  Participates in taekwondo  ? ?Social Determinants of Health  ? ?Financial Resource Strain: Not on file  ?Food Insecurity: Not on file  ?Transportation Needs: Not on file  ?Physical Activity: Not on file  ?Stress: Not on file  ?Social Connections: Not on file  ? ? ?Allergies:  ?Allergies  ?Allergen Reactions  ? Amoxicillin Rash  ?  Did it involve swelling of the face/tongue/throat, SOB, or low BP? No ?Did it involve sudden or severe rash/hives, skin peeling, or any reaction on the inside of your mouth or nose? No ?Did you need to seek medical attention at a hospital or doctor's office? Yes ?When did it last happen?     pt was a baby  ?If all above answers are ?NO?, may proceed with cephalosporin use. ?  ? ? ?Metabolic Disorder Labs: ?Lab Results  ?Component Value Date  ? HGBA1C 6.1 (H) 05/25/2021  ? MPG 128 05/25/2021  ? MPG 140 10/23/2019  ? ?No results found for: PROLACTIN ?No results found for: CHOL, TRIG, HDL, CHOLHDL, VLDL, LDLCALC ?Lab Results  ?Component Value Date  ? TSH 3.94 05/25/2021  ? TSH 2.78 01/27/2017  ? ? ?Therapeutic Level Labs: ?No results found for: LITHIUM ?No results found for: VALPROATE ?No components found for:  CBMZ ? ?Current Medications: ?Current Outpatient Medications  ?Medication Sig Dispense Refill  ? atomoxetine (STRATTERA) 25 MG capsule Take 1 capsule (25 mg total) by mouth daily. 30 capsule 1  ? Continuous Blood Gluc Sensor (DEXCOM G6 SENSOR) MISC SMARTSIG:1 Topical Every 10 Days    ? hydrOXYzine (ATARAX/VISTARIL) 25 MG tablet Take 0.5-1 tablets (12.5-25 mg total) by mouth every 8 (eight) hours as needed for itching. 30 tablet 0  ? insulin aspart (NOVOLOG) 100 UNIT/ML FlexPen Inject 1-8 Units  into the skin 3 (three) times daily with meals. Divides carbs by 8 to get units    ? insulin glargine (LANTUS) 100 UNIT/ML Solostar Pen Inject 19 Units into the skin daily.    ? triamcinolone cream (KENALOG) 0.1 % Apply 1 application topically 2 (two) times daily. 30 g 0  ? ?No current facility-administered medications for this visit.  ? ? ? ?Musculoskeletal: ?Strength & Muscle Tone: within normal limits ?Gait & Station: normal ?Patient leans: N/A ? ?Psychiatric Specialty Exam: ?Review of Systems  ?Psychiatric/Behavioral:  Positive for behavioral problems, decreased concentration and sleep disturbance. Negative for dysphoric mood, hallucinations, self-injury and suicidal ideas. The patient is nervous/anxious. The patient is not hyperactive.    ?Blood pressure (!) 145/60, pulse 77, height 5\' 6"  (1.676 m), weight 142 lb (64.4 kg).Body mass index is 22.92 kg/m?.  ?General Appearance: Bizarre and Casual  ?Eye Contact:  Fair  ?Speech:  Clear and Coherent and Normal Rate  ?Volume:  Normal  ?Mood:  Anxious and Depressed  ?Affect:  Congruent  ?Thought Process:  Coherent and Descriptions of Associations: Intact  ?Orientation:  Full (Time, Place, and Person)  ?Thought Content: WDL and Obsessions   ?Suicidal Thoughts:  No  ?Homicidal Thoughts:  No  ?Memory:  Immediate;   Fair ?Recent;   Fair ?Remote;   Fair  ?Judgement:  Fair  ?Insight:  Present  ?Psychomotor Activity:  Decreased  ?Concentration:  Concentration: Fair and Attention Span: Fair  ?Recall:  Fair  ?Fund of Knowledge: Fair  ?Language: Fair  ?Akathisia:  No  ?Handed:  Left  ?AIMS (if indicated): not done  ?Assets:  Communication Skills ?Desire for Improvement ?Social Support  ?ADL's:  Intact  ?Cognition: Impaired,  Mild  ?Sleep:  Fair  ? ?Screenings: ?AIMS   ? ?Flowsheet Row [facility]  ?AIMS Total Score 0  ? ?  ? ?PHQ2-9   ? ?Flowsheet Row [facility]  ?PHQ-2 Total Score 2  ?PHQ-9 Total Score 8  ? ?  ? ?Flowsheet Row [facility]

## 2021-08-31 NOTE — Addendum Note (Signed)
Addended by: Meta Hatchet on: 08/31/2021 07:18 AM ? ? Modules accepted: Level of Service ? ?

## 2021-10-29 ENCOUNTER — Encounter (HOSPITAL_COMMUNITY): Payer: Self-pay | Admitting: Physician Assistant

## 2021-10-29 ENCOUNTER — Ambulatory Visit (INDEPENDENT_AMBULATORY_CARE_PROVIDER_SITE_OTHER): Payer: Medicaid Other | Admitting: Physician Assistant

## 2021-10-29 VITALS — BP 127/79 | HR 73 | Ht 66.0 in | Wt 147.0 lb

## 2021-10-29 DIAGNOSIS — F840 Autistic disorder: Secondary | ICD-10-CM | POA: Diagnosis not present

## 2021-10-29 DIAGNOSIS — F339 Major depressive disorder, recurrent, unspecified: Secondary | ICD-10-CM | POA: Diagnosis not present

## 2021-10-29 DIAGNOSIS — F909 Attention-deficit hyperactivity disorder, unspecified type: Secondary | ICD-10-CM | POA: Diagnosis not present

## 2021-10-29 DIAGNOSIS — F411 Generalized anxiety disorder: Secondary | ICD-10-CM

## 2021-11-01 ENCOUNTER — Encounter (HOSPITAL_COMMUNITY): Payer: Self-pay | Admitting: Physician Assistant

## 2021-11-01 NOTE — Progress Notes (Cosign Needed Addendum)
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 During the last encounter, patient refused medication management.  Patient reports that he is not enjoying his day due to his job.  When describing his mood, patient describes himself as being uncomfortable as well as anxious.  Patient also endorses agitation along with hopelessness.  Overall, patient states that he is endorsing negative emotions and is anxious over losing control over his life.  Patient states that he would like dominance and being able to adapt to his environment.  Patient states that he would like to acquire a whole host of skills as well as have a reputation for doing something worthwhile.  He reports that he is trying to get in a car and is trying to be more adept at using social media to gain infamy.  Patient expresses to provider that he would like to either be a Administrator or go into the [employer].  Patient has aspirations of wanting to join the [employer] so that he can use weapons to kill people.  Patient was encouraged to strive for a more positive output.  Provider informed patient that he could talk about his emotions and what his aspirations are to a therapist.  Patient describes having depression due to not having a skill set and not being able to be better.  Provider encouraged patient to seek out therapy to talk about his emotions and discover what he may be good at.  Patient unable to perform a PHQ-9 or GAD-7 screen at this time.  Patient is alert and  oriented x4, calm, cooperative, and engaged in conversation during the encounter.  Patient describes himself as being a mindless task while winging life.  Patient denies suicidal or homicidal ideation.  He further denies auditory or visual hallucinations and does not appear to be responding to internal/external stimuli.  Patient unable to quantify the amount of sleep he has.  Patient endorses decreased appetite and eats on average 1 meal per day.  Patient denies alcohol consumption, tobacco use, and illicit drug use.  Visit Diagnosis:  No diagnosis found.   Past Psychiatric History:  Anxiety Depression ADHD PDD NOS (Pervasive Developmental Disorder-Not Otherwise Specified) Autism spectrum disorder  Past Medical History:  Past Medical History:  Diagnosis Date   ADHD (attention deficit hyperactivity disorder)    Autism spectrum disorder    Developmental delay    Diabetes mellitus without complication ([place])    Type I   Dysgraphia    History of tympanoplasty of right ear    [facility]   Receptive-expressive language delay     Past Surgical History:  Procedure Laterality Date   ingrown toenails Bilateral    TONSILLECTOMY AND ADENOIDECTOMY     tubes in ear     and another ear surgery to fix the hole where the tubes fell out    Family Psychiatric History:  Mother - ADD + auditory processing disorder. Per mother, bipolar disorder and depression run in her family. Father - Schizophrenia Uncle - Schizophrenia Grandmother (paternal) - Schizophrenia  Family History:  Family History  Problem Relation Age of Onset   ADD / ADHD Mother    Crohn's disease Mother    Schizophrenia Father     Social History:  Social History   Socioeconomic History   Marital status: Single    Spouse name: Not on file   Number of children: Not on file   Years of education: Not on file   Highest education level: Not on file  Occupational History   Not on file  Tobacco Use   Smoking status:  Never   Smokeless tobacco: Never  Vaping Use   Vaping Use: Never used  Substance and Sexual Activity   Alcohol use: No   Drug use: No   Sexual activity: Never  Other Topics Concern   Not on file  Social History Narrative   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 No Did it involve sudden or severe rash/hives, skin peeling, or any reaction on the inside of your mouth or nose? No Did you need to seek medical attention at a hospital or doctor's office? Yes When did it last happen?     pt was a baby  If all above answers are "NO", may proceed with cephalosporin use.     Metabolic Disorder Labs: Lab Results  Component Value Date   HGBA1C 6.1 (H) 05/25/2021   MPG 128 05/25/2021   MPG 140 10/23/2019   No results found for: PROLACTIN No results found for: CHOL, TRIG, HDL, CHOLHDL, VLDL, LDLCALC Lab Results  Component Value Date   TSH 3.94 05/25/2021   TSH 2.78 01/27/2017    Therapeutic Level Labs: No results found for: LITHIUM No results found for: VALPROATE No components found for:  CBMZ  Current Medications: Current Outpatient Medications  Medication Sig Dispense Refill   atomoxetine (STRATTERA) 25 MG capsule Take 1 capsule (25 mg total) by mouth daily. 30 capsule 1   Continuous Blood Gluc Sensor (DEXCOM G6 SENSOR) MISC SMARTSIG:1 Topical Every 10 Days     hydrOXYzine (ATARAX/VISTARIL) 25 MG tablet Take 0.5-1 tablets (12.5-25 mg total) by mouth every 8 (eight) hours as needed for itching. 30 tablet 0   insulin aspart (NOVOLOG) 100 UNIT/ML FlexPen Inject 1-8 Units into the skin 3 (three) times  daily with meals. Divides carbs by 8 to get units     insulin glargine (LANTUS) 100 UNIT/ML Solostar Pen Inject 19 Units into the skin daily.     triamcinolone cream (KENALOG) 0.1 % Apply 1 application topically 2 (two) times daily. 30 g 0   No current facility-administered medications for this visit.     Musculoskeletal: Strength & Muscle Tone: within normal limits Gait & Station: normal Patient leans: N/A  Psychiatric Specialty Exam: Review of Systems  Psychiatric/Behavioral:  Positive for behavioral problems, dysphoric mood and sleep disturbance. Negative for decreased concentration, hallucinations, self-injury and suicidal ideas. The patient is nervous/anxious. The patient is not hyperactive.    Blood pressure 127/79, pulse 73, height 5\' 6"  (1.676 m), weight 147 lb (66.7 kg).Body mass index is 23.73 kg/m.  General Appearance: Bizarre and Casual  Eye Contact:  Fair  Speech:  Clear and Coherent and Normal Rate  Volume:  Normal  Mood:  Anxious and Depressed  Affect:  Congruent  Thought Process:  Coherent and Descriptions of Associations: Intact  Orientation:  Full (Time, Place, and Person)  Thought Content: WDL and Obsessions   Suicidal Thoughts:  No  Homicidal Thoughts:  No  Memory:  Immediate;   Fair Recent;   Fair Remote;   Fair  Judgement:  Fair  Insight:  Present  Psychomotor Activity:  Decreased  Concentration:  Concentration: Fair and Attention Span: Fair  Recall:  [employer] of Knowledge: Fair  Language: Fair  Akathisia:  No  Handed:  Left  AIMS (if indicated): not done  Assets:  Communication Skills Desire for Improvement Social Support  ADL's:  Intact  Cognition: Impaired,  Mild  Sleep:  Fair   Screenings: AIMS    Flowsheet Row Admission (Discharged) from 10/22/2019 in [place] CHILD/[facility]  AIMS Total Score 0      PHQ2-9    [place] Visit from 08/26/2020 in [place]  PHQ-2 Total Score 2   PHQ-9 Total Score 8      [place] Visit from 10/29/2021 in [facility] Office Visit from 08/27/2021 in 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  Patient expresses a mixture of emotions due to not being properly fulfilled in life.  He discusses not having any skill sets but is wanting a career that garners a reputation.  Provider heavily suggested patient reach out to therapy to discuss his emotions and to determine what goals he has.  Provider gave patient resources for the [place] and was told to contact the facility for more information regarding what resources they provide.  Collaboration of Care: Collaboration of Care: Psychiatrist AEB patient being followed by a mental health provider  Patient/Guardian was advised Release of Information must be obtained prior to any record release in order to collaborate their care with an outside provider. Patient/Guardian was advised if they have not already done so to contact the registration department to sign all necessary forms in order for [place] to release information regarding their care.   Consent: Patient/Guardian gives verbal consent for treatment and assignment of benefits for services provided during this visit. Patient/Guardian expressed understanding and agreed to proceed.   1. Autism spectrum disorder   2. Anxiety state   3. Episode of recurrent major depressive disorder, unspecified depression episode severity  ([place])   4. Attention deficit hyperactivity disorder (ADHD), unspecified ADHD type  Patient to follow up in 2 months Provider spent a total of 15 minutes with the patient/reviewing the patient's chart  Malachy Mood, PA 10/29/2021, 3:05 PM

## 2021-11-19 ENCOUNTER — Ambulatory Visit (INDEPENDENT_AMBULATORY_CARE_PROVIDER_SITE_OTHER): Payer: Medicaid Other | Admitting: Family Medicine

## 2021-11-19 ENCOUNTER — Encounter: Payer: Self-pay | Admitting: Family Medicine

## 2021-11-19 VITALS — BP 118/90 | HR 66 | Temp 98.2°F | Ht 66.0 in | Wt 152.0 lb

## 2021-11-19 DIAGNOSIS — E1069 Type 1 diabetes mellitus with other specified complication: Secondary | ICD-10-CM | POA: Diagnosis not present

## 2021-11-19 DIAGNOSIS — Z0000 Encounter for general adult medical examination without abnormal findings: Secondary | ICD-10-CM | POA: Diagnosis not present

## 2021-11-19 DIAGNOSIS — H6121 Impacted cerumen, right ear: Secondary | ICD-10-CM

## 2021-11-19 DIAGNOSIS — F840 Autistic disorder: Secondary | ICD-10-CM | POA: Diagnosis not present

## 2021-11-19 DIAGNOSIS — F251 Schizoaffective disorder, depressive type: Secondary | ICD-10-CM

## 2021-11-19 NOTE — Progress Notes (Signed)
Subjective:    Patient ID: Jeffrey Delgado, male    DOB: 11/11/2000, 21 y.o.   MRN: [id number]  HPI Patient is here today for complete physical exam.  He is still living with his grandparents and his mother.  He is working at [employer].  Patient now has numerous piercings in his face and numerous tattoos all over his body.  He continues to be very withdrawn and quiet.  He is slow to speak and does not like to answer questions.  He states that he is still not getting along well with his mother.  He states that he recently saw his endocrinologist at [facility] and that his A1c was 7.  He denies any polyuria polydipsia or blurry vision.  His height is 5 foot 6 today with 10 percentile.  His weight is 152 pounds which is 45th percentile.  He states that he is only here because he cannot hear out of his right ear.  On examination he has a cerumen impaction in the right auditory canal that we were easily able to remove with irrigation and lavage which seem to rectify the problem immediately. Past Medical History:  Diagnosis Date   ADHD (attention deficit hyperactivity disorder)    Autism spectrum disorder    Developmental delay    Diabetes mellitus without complication (HCC)    Type I   Dysgraphia    History of tympanoplasty of right ear    [facility]   Receptive-expressive language delay    Past Surgical History:  Procedure Laterality Date   ingrown toenails Bilateral    TONSILLECTOMY AND ADENOIDECTOMY     tubes in ear     and another ear surgery to fix the hole where the tubes fell out   Current Outpatient Medications on File Prior to Visit  Medication Sig Dispense Refill   Continuous Blood Gluc Sensor (DEXCOM G6 SENSOR) MISC SMARTSIG:1 Topical Every 10 Days     insulin aspart (NOVOLOG) 100 UNIT/ML FlexPen Inject 1-8 Units into the skin 3 (three) times daily with meals. Divides carbs by 8 to get units     insulin glargine (LANTUS) 100 UNIT/ML Solostar Pen Inject 19 Units into the skin  daily.     atomoxetine (STRATTERA) 25 MG capsule Take 1 capsule (25 mg total) by mouth daily. (Patient not taking: Reported on 11/19/2021) 30 capsule 1   hydrOXYzine (ATARAX/VISTARIL) 25 MG tablet Take 0.5-1 tablets (12.5-25 mg total) by mouth every 8 (eight) hours as needed for itching. (Patient not taking: Reported on 11/19/2021) 30 tablet 0   triamcinolone cream (KENALOG) 0.1 % Apply 1 application topically 2 (two) times daily. (Patient not taking: Reported on 11/19/2021) 30 g 0   No current facility-administered medications on file prior to visit.   Allergies  Allergen Reactions   Amoxicillin Rash    Did it involve swelling of the face/tongue/throat, SOB, or low BP? No Did it involve sudden or severe rash/hives, skin peeling, or any reaction on the inside of your mouth or nose? No Did you need to seek medical attention at a hospital or doctor's office? Yes When did it last happen?     pt was a baby  If all above answers are "NO", may proceed with cephalosporin use.    Social History   Socioeconomic History   Marital status: Single    Spouse name: Not on file   Number of children: Not on file   Years of education: Not on file   Highest education level:  Not on file  Occupational History   Not on file  Tobacco Use   Smoking status: Never   Smokeless tobacco: Never  Vaping Use   Vaping Use: Never used  Substance and Sexual Activity   Alcohol use: No   Drug use: No   Sexual activity: Never  Other Topics Concern   Not on file  Social History Narrative   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  Participates in taekwondo   Social Determinants of Health   Financial Resource Strain: Not on file  Food Insecurity: Not on file  Transportation Needs: Not on file  Physical Activity: Not on file  Stress: Not on file  Social Connections: Not on file  Intimate Partner Violence: Not on file     Review of Systems  All other systems reviewed and are negative.      Objective:   Physical  Exam Constitutional:      General: He is not in acute distress.    Appearance: He is not ill-appearing or toxic-appearing.  Cardiovascular:     Rate and Rhythm: Normal rate and regular rhythm.     Pulses: Normal pulses.     Heart sounds: Normal heart sounds. No murmur heard.    No friction rub. No gallop.  Pulmonary:     Effort: Pulmonary effort is normal. No respiratory distress.     Breath sounds: Normal breath sounds. No stridor. No wheezing, rhonchi or rales.  Abdominal:     General: Abdomen is flat. Bowel sounds are normal. There is no distension.     Palpations: Abdomen is soft.     Tenderness: There is no abdominal tenderness. There is no guarding.  Musculoskeletal:     Right lower leg: No edema.     Left lower leg: No edema.  Neurological:     Mental Status: He is alert.  Psychiatric:        Attention and Perception: Attention normal.        Mood and Affect: Affect is flat.        Speech: Speech is delayed.        Behavior: Behavior normal.        Assessment & Plan:  General medical exam  Schizoaffective disorder, depressive type (HCC)  Type 1 diabetes mellitus with other specified complication (HCC)  Autism spectrum disorder I still returned with the patient may have underlying schizophrenia in addition to his autism.  Diabetes is currently being managed by his endocrinologist.  His blood pressure in the remainder of his physical exam today is normal.  His immunizations are up-to-date.  I believe the only medical concern I have that is not currently being addressed is his underlying mental health.  However this is a very difficult subject as the patient denies any issues.  He was previously seeing psychiatry.  Plan to make a referral back to psychiatry to ensure that he is following up as planned.Marland Kitchen

## 2021-11-26 ENCOUNTER — Other Ambulatory Visit: Payer: Self-pay

## 2021-11-26 ENCOUNTER — Emergency Department (HOSPITAL_COMMUNITY)
Admission: EM | Admit: 2021-11-26 | Discharge: 2021-11-26 | Disposition: A | Discharge status: Home / Self Care | Payer: Medicaid Other | Attending: Emergency Medicine | Admitting: Emergency Medicine

## 2021-11-26 ENCOUNTER — Encounter (HOSPITAL_COMMUNITY): Payer: Self-pay

## 2021-11-26 DIAGNOSIS — L03113 Cellulitis of right upper limb: Secondary | ICD-10-CM | POA: Diagnosis not present

## 2021-11-26 DIAGNOSIS — E119 Type 2 diabetes mellitus without complications: Secondary | ICD-10-CM | POA: Insufficient documentation

## 2021-11-26 DIAGNOSIS — F840 Autistic disorder: Secondary | ICD-10-CM | POA: Insufficient documentation

## 2021-11-26 DIAGNOSIS — R21 Rash and other nonspecific skin eruption: Secondary | ICD-10-CM | POA: Diagnosis present

## 2021-11-26 DIAGNOSIS — L0390 Cellulitis, unspecified: Secondary | ICD-10-CM

## 2021-11-26 LAB — COMPREHENSIVE METABOLIC PANEL
ALT: 14 U/L (ref 0–44)
AST: 17 U/L (ref 15–41)
Albumin: 4.6 g/dL (ref 3.5–5.0)
Alkaline Phosphatase: 91 U/L (ref 38–126)
Anion gap: 16 — ABNORMAL HIGH (ref 5–15)
BUN: 9 mg/dL (ref 6–20)
CO2: 22 mmol/L (ref 22–32)
Calcium: 10.1 mg/dL (ref 8.9–10.3)
Chloride: 101 mmol/L (ref 98–111)
Creatinine, Ser: 0.77 mg/dL (ref 0.61–1.24)
GFR, Estimated: >60 mL/min (ref >60)
Glucose, Bld: 192 mg/dL — ABNORMAL HIGH (ref 70–99)
Potassium: 3.8 mmol/L (ref 3.5–5.1)
Sodium: 139 mmol/L (ref 135–145)
Total Bilirubin: 1.6 mg/dL — ABNORMAL HIGH (ref 0.3–1.2)
Total Protein: 7.9 g/dL (ref 6.5–8.1)

## 2021-11-26 LAB — CBC WITH DIFFERENTIAL/PLATELET
Abs Immature Granulocytes: 0.03 10*3/uL (ref 0.00–0.07)
Basophils Absolute: 0 10*3/uL (ref 0.0–0.1)
Basophils Relative: 0 %
Eosinophils Absolute: 0 10*3/uL (ref 0.0–0.5)
Eosinophils Relative: 0 %
HCT: 46.3 % (ref 39.0–52.0)
Hemoglobin: 15.5 g/dL (ref 13.0–17.0)
Immature Granulocytes: 0 %
Lymphocytes Relative: 8 %
Lymphs Abs: 1 10*3/uL (ref 0.7–4.0)
MCH: 30.8 pg (ref 26.0–34.0)
MCHC: 33.5 g/dL (ref 30.0–36.0)
MCV: 91.9 fL (ref 80.0–100.0)
Monocytes Absolute: 0.8 10*3/uL (ref 0.1–1.0)
Monocytes Relative: 6 %
Neutro Abs: 10.7 10*3/uL — ABNORMAL HIGH (ref 1.7–7.7)
Neutrophils Relative %: 86 %
Platelets: 212 10*3/uL (ref 150–400)
RBC: 5.04 MIL/uL (ref 4.22–5.81)
RDW: 11.5 % (ref 11.5–15.5)
WBC: 12.5 10*3/uL — ABNORMAL HIGH (ref 4.0–10.5)
nRBC: 0 % (ref 0.0–0.2)

## 2021-11-26 LAB — CBG MONITORING, ED: Glucose-Capillary: 186 mg/dL — ABNORMAL HIGH (ref 70–99)

## 2021-11-26 MED ORDER — DOXYCYCLINE HYCLATE 100 MG PO TABS
100.0000 mg | ORAL_TABLET | Freq: Once | ORAL | Status: AC
  Administered 2021-11-26: 100 mg via ORAL
  Filled 2021-11-26: qty 1

## 2021-11-26 MED ORDER — DOXYCYCLINE HYCLATE 100 MG PO CAPS: 100.0000 mg | ORAL_CAPSULE | Freq: Two times a day (BID) | ORAL | 0 refills | Status: AC

## 2021-11-26 NOTE — ED Provider Triage Note (Signed)
Emergency Medicine Provider Triage Evaluation Note  ALHAJI MCNEAL , a 21 y.o. male  was evaluated in triage.  Pt complains of right arm pain.  Got his sleeve tattoo touched up on 11/24/21 and afterwards has had increased pain, redness, and itching.  He is concerned for possible infection.  Unsure about fever.  Hx of DM1.  Review of Systems  Positive: Arm pain, itching, redness Negative: fever  Physical Exam  BP (!) 139/92   Pulse 100   Temp 98.4 F (36.9 C) (Oral)   Resp 18   SpO2 99%  Gen:   Awake, no distress   Resp:  Normal effort  MSK:   Moves extremities without difficulty  Other:  Right sleeve tattoo with areas of touch up along forearm/elbow, there is some scabbing, erythema, and warmth to touch present  Medical Decision Making  Medically screening exam initiated at 2:16 AM.  Appropriate orders placed.  Nicco A Villalon was informed that the remainder of the evaluation will be completed by another provider, this initial triage assessment does not replace that evaluation, and the importance of remaining in the ED until their evaluation is complete.  Arm pain, itching, redness s/p tattoo touch up.  CBG 186.  Will check basic labs.   Garlon Hatchet, PA-C 11/26/21 [phone]

## 2021-11-26 NOTE — ED Triage Notes (Signed)
Pt reports that he had a tattoo touched up tow days ago and since has had redness, swelling and irritation

## 2021-11-26 NOTE — Discharge Instructions (Signed)
You were evaluated in the Emergency Department and after careful evaluation, we did not find any emergent condition requiring admission or further testing in the hospital.  Your exam/testing today is overall reassuring.  Symptoms seem to be due to a skin infection.  Take the doxycycline antibiotic as directed.  Use Tylenol or Motrin as needed for discomfort.  Please return to the Emergency Department if you experience any worsening of your condition.   Thank you for allowing [place] to be a part of your care.

## 2021-12-31 ENCOUNTER — Encounter (HOSPITAL_COMMUNITY): Payer: Medicaid Other | Admitting: Physician Assistant

## 2022-01-08 ENCOUNTER — Other Ambulatory Visit: Payer: Self-pay

## 2022-01-08 ENCOUNTER — Emergency Department (HOSPITAL_COMMUNITY)
Admission: EM | Admit: 2022-01-08 | Discharge: 2022-01-08 | Discharge status: Against Medical Advice | Payer: Medicaid Other

## 2022-01-08 NOTE — ED Notes (Signed)
Registration told this tech that this patient is going to [facility]

## 2022-02-02 ENCOUNTER — Encounter (HOSPITAL_COMMUNITY): Payer: Medicaid Other | Admitting: Physician Assistant

## 2022-02-10 ENCOUNTER — Ambulatory Visit (INDEPENDENT_AMBULATORY_CARE_PROVIDER_SITE_OTHER): Payer: Medicaid Other | Admitting: Physician Assistant

## 2022-02-10 ENCOUNTER — Encounter (HOSPITAL_COMMUNITY): Payer: Self-pay | Admitting: Physician Assistant

## 2022-02-10 VITALS — BP 136/69 | HR 86 | Wt 157.0 lb

## 2022-02-10 DIAGNOSIS — F840 Autistic disorder: Secondary | ICD-10-CM

## 2022-02-10 DIAGNOSIS — F411 Generalized anxiety disorder: Secondary | ICD-10-CM | POA: Diagnosis not present

## 2022-02-10 DIAGNOSIS — F339 Major depressive disorder, recurrent, unspecified: Secondary | ICD-10-CM

## 2022-02-10 DIAGNOSIS — F909 Attention-deficit hyperactivity disorder, unspecified type: Secondary | ICD-10-CM | POA: Diagnosis not present

## 2022-02-10 NOTE — Progress Notes (Signed)
BH MD/PA/NP OP Progress Note  02/10/2022 10:31 AM TAREEK SABO  MRN:  [id number]  Chief Complaint:  Chief Complaint  Patient presents with   Medication Management   HPI:   Jyair A. 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 During the last encounter, patient refused medication management.  Patient reports that things have been okay since his last encounter.  Patient states that he now has his own car and that he has been going to the gym mostly to work out whenever he is available to.  Patient states that he is often bored with work whenever he is not at the gym.  Patient still denies having a sense of purpose but would like to have a career and getting hunting due to not being able to join the [employer].  Patient also has aspirations of going to college to improve his career background but states that he wants to finish his tattoos first.  Patient endorses having depression whenever he does not have anything to do.  He endorses having depression most days characterized by the following symptoms: hopelessness, hatred, loss of purpose, decreased motivation, and states that it is hard to feel anything.  Patient states that he has hatred over his body and mind because they hold him back.  Patient states that there is nothing to improve with his mind or body at this time.  Patient endorses anxiety whenever he is in a sticky situation that he cannot waddle himself out of.  Patient denies any new stressors besides what he wants to do in life.  Patient denies speaking to his parents about his problems because they cannot solve his problems.  Patient adds that he has difficulty remembering things.  He states that he may end up in a  totally different medication than intended on occasion.  States that he will focus on his problems to the point of making mistakes at his job as well.  Although patient has experienced issues with performing a PHQ-9 and GAD-7 screen, provider encouraged patient to do a PHQ-9 screen as accurately as possible.  A PHQ-9 screen was performed with the patient scoring a 10.  Patient is alert and oriented x4, calm, cooperative, and engaged in conversation during the encounter.  Patient endorses frustration that he is not able to do what he wants to do.  Patient also expresses ruminating over past mistakes.  Patient denies suicidal ideations.  He endorses passive homicidal ideations but states that he has no plan or intent to harm anyone.  Patient further denies auditory or visual hallucinations and does not appear to be responding to internal/external stimuli.  Patient endorses fair sleep.  Patient endorses fair appetite and eats on average 2 meals per day.  Patient denies alcohol consumption, tobacco use, and illicit drug use.  Visit Diagnosis:    ICD-10-CM   1. Attention deficit hyperactivity disorder (ADHD), unspecified ADHD type  F90.9     2. Autism spectrum disorder  F84.0     3. Episode of recurrent major depressive disorder, unspecified depression episode severity (HCC)  F33.9     4. Anxiety state  F41.1       Past Psychiatric History:  Anxiety Depression ADHD PDD NOS (Pervasive Developmental Disorder-Not Otherwise Specified) Autism spectrum disorder  Past Medical History:  Past Medical History:  Diagnosis  Date   ADHD (attention deficit hyperactivity disorder)    Autism spectrum disorder    Developmental delay    Diabetes mellitus without complication ([place])    Type I   Dysgraphia    History of tympanoplasty of right ear    [facility] 2015   Receptive-expressive language delay     Past Surgical History:  Procedure Laterality Date   ingrown toenails Bilateral    TONSILLECTOMY AND  ADENOIDECTOMY     tubes in ear     and another ear surgery to fix the hole where the tubes fell out    Family Psychiatric History:  Mother - ADD + auditory processing disorder. Per mother, bipolar disorder and depression run in her family. Father - Schizophrenia Uncle - Schizophrenia Grandmother (paternal) - Schizophrenia  Family History:  Family History  Problem Relation Age of Onset   ADD / ADHD Mother    Crohn's disease Mother    Schizophrenia Father     Social History:  Social History   Socioeconomic History   Marital status: Single    Spouse name: Not on file   Number of children: Not on file   Years of education: Not on file   Highest education level: Not on file  Occupational History   Not on file  Tobacco Use   Smoking status: Never   Smokeless tobacco: Never  Vaping Use   Vaping Use: Never used  Substance and Sexual Activity   Alcohol use: No   Drug use: No   Sexual activity: Never  Other Topics Concern   Not on file  Social History Narrative   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 No Did it involve sudden or severe rash/hives, skin peeling, or any reaction on the inside of your mouth or nose? No Did you need to seek medical attention at a hospital or doctor's office? Yes When did it last happen?     pt was a baby  If all above answers are "NO", may proceed with cephalosporin use.     Metabolic Disorder Labs: Lab Results  Component Value Date   HGBA1C 6.1 (H) 05/25/2021   MPG 128 05/25/2021   MPG 140 10/23/2019   No results found for: "PROLACTIN" No results found  for: "CHOL", "TRIG", "HDL", "CHOLHDL", "VLDL", "LDLCALC" Lab Results  Component Value Date   TSH 3.94 05/25/2021   TSH 2.78 01/27/2017    Therapeutic Level Labs: No results found for: "LITHIUM" No results found for: "VALPROATE" No results found for: "CBMZ"  Current Medications: Current Outpatient Medications  Medication Sig Dispense Refill   atomoxetine (STRATTERA) 25 MG capsule Take 1 capsule (25 mg total) by mouth daily. (Patient not taking: Reported on 11/19/2021) 30 capsule 1   Continuous Blood Gluc Sensor (DEXCOM G6 SENSOR) MISC SMARTSIG:1 Topical Every 10 Days     hydrOXYzine (ATARAX/VISTARIL) 25 MG tablet Take 0.5-1 tablets (12.5-25 mg total) by mouth every 8 (eight) hours as needed for itching. (Patient not taking: Reported on 11/19/2021) 30 tablet 0   insulin aspart (NOVOLOG) 100 UNIT/ML FlexPen Inject 1-8 Units into the skin  3 (three) times daily with meals. Divides carbs by 8 to get units     insulin glargine (LANTUS) 100 UNIT/ML Solostar Pen Inject 19 Units into the skin daily.     triamcinolone cream (KENALOG) 0.1 % Apply 1 application topically 2 (two) times daily. (Patient not taking: Reported on 11/19/2021) 30 g 0   No current facility-administered medications for this visit.     Musculoskeletal: Strength & Muscle Tone: within normal limits Gait & Station: normal Patient leans: N/A  Psychiatric Specialty Exam: Review of Systems  Psychiatric/Behavioral:  Positive for behavioral problems, dysphoric mood and sleep disturbance. Negative for decreased concentration, hallucinations, self-injury and suicidal ideas. The patient is nervous/anxious. The patient is not hyperactive.     Blood pressure 136/69, pulse 86, weight 157 lb (71.2 kg), SpO2 100 %.Body mass index is 25.34 kg/m.  General Appearance: Bizarre and Casual  Eye Contact:  Fair  Speech:  Clear and Coherent and Normal Rate  Volume:  Normal  Mood:  Anxious and Depressed  Affect:  Congruent  Thought Process:   Coherent and Descriptions of Associations: Intact  Orientation:  Full (Time, Place, and Person)  Thought Content: WDL and Obsessions   Suicidal Thoughts:  No  Homicidal Thoughts:  No  Memory:  Immediate;   Fair Recent;   Fair Remote;   Fair  Judgement:  Fair  Insight:  Present  Psychomotor Activity:  Decreased  Concentration:  Concentration: Fair and Attention Span: Fair  Recall:  [employer] of Knowledge: Fair  Language: Fair  Akathisia:  No  Handed:  Left  AIMS (if indicated): not done  Assets:  Communication Skills Desire for Improvement Social Support  ADL's:  Intact  Cognition: Impaired,  Mild  Sleep:  Fair   Screenings: AIMS    Flowsheet Row Admission (Discharged) from 10/22/2019 in [facility]  AIMS Total Score 0      PHQ2-9    [place] Visit from 02/10/2022 in [facility] Office Visit from 11/19/2021 in [place] Office Visit from 08/26/2020 in [place]  PHQ-2 Total Score 4 3 2   PHQ-9 Total Score 10 6 8       [place] Office Visit from 02/10/2022 in [facility] ED from 11/26/2021 in [place] Office Visit from 10/29/2021 in [place] No Risk No Risk No Risk        Assessment and Plan:   Trevin A. 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  Patient continues to express a mixture of emotions stemming from his depression and anxiety.  He expresses having hatred over his mind and body due to feeling that they need improvement.  Patient also notes that he has been dealing  with issues with concentration and attention stating that it often affects his ability to work.  Provider suggested patient be placed on Wellbutrin for the management of his depressive symptoms and issues with attention and concentration.  Patient denies medication management at this time but states that he will research the medication following the conclusion of the encounter.  Collaboration of Care: Collaboration of Care: Psychiatrist AEB patient being followed by a mental health provider  Patient/Guardian was advised Release  of Information must be obtained prior to any record release in order to collaborate their care with an outside provider. Patient/Guardian was advised if they have not already done so to contact the registration department to sign all necessary forms in order for [place] to release information regarding their care.   Consent: Patient/Guardian gives verbal consent for treatment and assignment of benefits for services provided during this visit. Patient/Guardian expressed understanding and agreed to proceed.   1. Attention deficit hyperactivity disorder (ADHD), unspecified ADHD type   2. Autism spectrum disorder   3. Episode of recurrent major depressive disorder, unspecified depression episode severity (HCC)   4. Anxiety state  Patient to follow up in 6 weeks Provider spent a total of 23 minutes with the patient/reviewing the patient's chart  Meta Hatchet, PA 10/29/2021, 3:05 PM

## 2022-02-22 ENCOUNTER — Encounter: Payer: Self-pay | Admitting: Family Medicine

## 2022-10-23 ENCOUNTER — Ambulatory Visit (HOSPITAL_COMMUNITY)
Admission: EM | Admit: 2022-10-23 | Discharge: 2022-10-25 | Disposition: A | Discharge status: Other Acute Inpatient Hospital | Payer: Medicaid Other | Attending: Family Medicine | Admitting: Family Medicine

## 2022-10-23 DIAGNOSIS — F339 Major depressive disorder, recurrent, unspecified: Secondary | ICD-10-CM

## 2022-10-23 DIAGNOSIS — E109 Type 1 diabetes mellitus without complications: Secondary | ICD-10-CM | POA: Diagnosis not present

## 2022-10-23 DIAGNOSIS — R4584 Anhedonia: Secondary | ICD-10-CM | POA: Insufficient documentation

## 2022-10-23 DIAGNOSIS — R45851 Suicidal ideations: Secondary | ICD-10-CM | POA: Diagnosis not present

## 2022-10-23 LAB — GLUCOSE, CAPILLARY
Glucose-Capillary: 196 mg/dL — ABNORMAL HIGH (ref 70–99)
Glucose-Capillary: 120 mg/dL — ABNORMAL HIGH (ref 70–99)

## 2022-10-23 LAB — CBC WITH DIFFERENTIAL/PLATELET
Abs Immature Granulocytes: 0.04 10*3/uL (ref 0.00–0.07)
Basophils Absolute: 0 10*3/uL (ref 0.0–0.1)
Basophils Relative: 0 %
Eosinophils Absolute: 0 10*3/uL (ref 0.0–0.5)
Eosinophils Relative: 0 %
HCT: 49 % (ref 39.0–52.0)
Hemoglobin: 16.2 g/dL (ref 13.0–17.0)
Immature Granulocytes: 1 %
Lymphocytes Relative: 18 %
Lymphs Abs: 1.3 10*3/uL (ref 0.7–4.0)
MCH: 30.9 pg (ref 26.0–34.0)
MCHC: 33.1 g/dL (ref 30.0–36.0)
MCV: 93.3 fL (ref 80.0–100.0)
Monocytes Absolute: 0.5 10*3/uL (ref 0.1–1.0)
Monocytes Relative: 6 %
Neutro Abs: 5.2 10*3/uL (ref 1.7–7.7)
Neutrophils Relative %: 75 %
Platelets: 215 10*3/uL (ref 150–400)
RBC: 5.25 MIL/uL (ref 4.22–5.81)
RDW: 11.9 % (ref 11.5–15.5)
WBC: 7 10*3/uL (ref 4.0–10.5)
nRBC: 0 % (ref 0.0–0.2)

## 2022-10-23 LAB — ETHANOL: Alcohol, Ethyl (B): <10 mg/dL (ref <10)

## 2022-10-23 LAB — HEMOGLOBIN A1C
Hgb A1c MFr Bld: 7 % — ABNORMAL HIGH (ref 4.8–5.6)
Mean Plasma Glucose: 154.2 mg/dL

## 2022-10-23 LAB — LIPID PANEL
Cholesterol: 188 mg/dL (ref 0–200)
HDL: 69 mg/dL (ref >40)
LDL Cholesterol: 106 mg/dL — ABNORMAL HIGH (ref 0–99)
Total CHOL/HDL Ratio: 2.7 RATIO
Triglycerides: 66 mg/dL (ref <150)
VLDL: 13 mg/dL (ref 0–40)

## 2022-10-23 LAB — COMPREHENSIVE METABOLIC PANEL
ALT: 23 U/L (ref 0–44)
AST: 23 U/L (ref 15–41)
Albumin: 4.4 g/dL (ref 3.5–5.0)
Alkaline Phosphatase: 80 U/L (ref 38–126)
Anion gap: 9 (ref 5–15)
BUN: 6 mg/dL (ref 6–20)
CO2: 25 mmol/L (ref 22–32)
Calcium: 9.3 mg/dL (ref 8.9–10.3)
Chloride: 103 mmol/L (ref 98–111)
Creatinine, Ser: 0.6 mg/dL — ABNORMAL LOW (ref 0.61–1.24)
GFR, Estimated: >60 mL/min (ref >60)
Glucose, Bld: 182 mg/dL — ABNORMAL HIGH (ref 70–99)
Potassium: 3.5 mmol/L (ref 3.5–5.1)
Sodium: 137 mmol/L (ref 135–145)
Total Bilirubin: 1.6 mg/dL — ABNORMAL HIGH (ref 0.3–1.2)
Total Protein: 7.5 g/dL (ref 6.5–8.1)

## 2022-10-23 LAB — POCT URINE DRUG SCREEN - MANUAL ENTRY (I-SCREEN)
POC Amphetamine UR: NOT DETECTED
POC Buprenorphine (BUP): NOT DETECTED
POC Cocaine UR: NOT DETECTED
POC Marijuana UR: NOT DETECTED
POC Methadone UR: NOT DETECTED
POC Methamphetamine UR: NOT DETECTED
POC Morphine: NOT DETECTED
POC Oxazepam (BZO): NOT DETECTED
POC Oxycodone UR: NOT DETECTED
POC Secobarbital (BAR): NOT DETECTED

## 2022-10-23 LAB — MAGNESIUM: Magnesium: 2.4 mg/dL (ref 1.7–2.4)

## 2022-10-23 LAB — TSH: TSH: 1.339 u[IU]/mL (ref 0.350–4.500)

## 2022-10-23 MED ORDER — TRAZODONE HCL 50 MG PO TABS
50.0000 mg | ORAL_TABLET | Freq: Every evening | ORAL | Status: DC | PRN
  Administered 2022-10-23 – 2022-10-24 (×2): 50 mg via ORAL
  Filled 2022-10-23 (×2): qty 1

## 2022-10-23 MED ORDER — INSULIN ASPART 100 UNIT/ML IJ SOLN
0.0000 [IU] | Freq: Four times a day (QID) | INTRAMUSCULAR | Status: DC
  Administered 2022-10-23: 9 [IU] via SUBCUTANEOUS
  Administered 2022-10-23: 2 [IU] via SUBCUTANEOUS
  Administered 2022-10-24: 5 [IU] via SUBCUTANEOUS
  Administered 2022-10-24: 3 [IU] via SUBCUTANEOUS

## 2022-10-23 MED ORDER — INSULIN GLARGINE-YFGN 100 UNIT/ML ~~LOC~~ SOLN
20.0000 [IU] | Freq: Every day | SUBCUTANEOUS | Status: DC
  Administered 2022-10-24 – 2022-10-25 (×2): 20 [IU] via SUBCUTANEOUS

## 2022-10-23 MED ORDER — HYDROXYZINE HCL 25 MG PO TABS
25.0000 mg | ORAL_TABLET | Freq: Three times a day (TID) | ORAL | Status: DC | PRN
  Administered 2022-10-23 – 2022-10-24 (×2): 25 mg via ORAL
  Filled 2022-10-23 (×2): qty 1

## 2022-10-23 MED ORDER — ALUM & MAG HYDROXIDE-SIMETH 200-200-20 MG/5ML PO SUSP: 30.0000 mL | ORAL | Status: DC | PRN

## 2022-10-23 MED ORDER — MAGNESIUM HYDROXIDE 400 MG/5ML PO SUSP: 30.0000 mL | Freq: Every day | ORAL | Status: DC | PRN

## 2022-10-23 MED ORDER — ACETAMINOPHEN 325 MG PO TABS: 650.0000 mg | ORAL_TABLET | Freq: Four times a day (QID) | ORAL | Status: DC | PRN

## 2022-10-23 MED ORDER — FLUOXETINE HCL 20 MG PO CAPS
20.0000 mg | ORAL_CAPSULE | Freq: Every day | ORAL | Status: DC
  Administered 2022-10-23 – 2022-10-25 (×3): 20 mg via ORAL
  Filled 2022-10-23 (×3): qty 1

## 2022-10-23 NOTE — Progress Notes (Signed)
   10/23/22 1237  [facility] Triage Screening (Walk-ins at [facility] only)  What Is the Reason for Your Visit/Call Today? Jeffrey Delgado is a 22 year old male who presents voluntarily unaccompanied to [facility]. Patient endorses SI without a specific plan.   He reports that he wished he could just lie down and wait for the end to come.   He denies HI or AVH.  He has previously been diagnosed with depression and problems with emotional stability.   When was involuntary committed when he was around 21-89 years old.  Due to assaulting police officers and destruction of property when thy tried to arrest him.  He has an upcoming court date July 18th for a speeding ticket, going 101 in a 55 zone.  Patient denies any alcohol or substance use due to him having a diagnosis of type 1 diabetes.  He recently began seeing a therapist but does not have psychiatrist is not currently being prescribed any psychotropic medications.  How Long Has This Been Causing You Problems? > than 6 months  Have You Recently Had Any Thoughts About Hurting Yourself? Yes  How long ago did you have thoughts about hurting yourself? 2 dyas ago  Are You Planning to Commit Suicide/Harm Yourself At This time? No  Have you Recently Had Thoughts About Hurting Someone [name]? No  Are You Planning To Harm Someone At This Time? No  Explanation: N/A  Are you currently experiencing any auditory, visual or other hallucinations? No  Have You Used Any Alcohol or Drugs in the Past 24 Hours? No  What Did You Use and How Much? N/A  Clinician description of patient physical appearance/behavior: Pt was alert and oiriented, cooperative and causually dressed  What Do You Feel Would Help You the Most Today? Treatment for Depression or other mood problem  If access to [facility] was not available, would you have sought care in the Emergency Department? No  Determination of Need Routine (7 days)  Options For Referral Medication Management;Outpatient Therapy;BH Urgent  Care

## 2022-10-23 NOTE — ED Notes (Signed)
Patient A&Ox4. Patient reports feeling hopeless and disparity. Patient currently denies SI/HI and AVH. Patient denies any physical complaints when asked. No acute distress noted. Support and encouragement provided. Routine safety checks conducted according to facility protocol. Encouraged patient to notify staff if thoughts of harm toward self or others arise. Patient verbalize understanding and agreement. Will continue to monitor for safety.

## 2022-10-23 NOTE — ED Notes (Signed)
Pt given low sugar snack.  Currently sitting up in bed. No distress noted. +

## 2022-10-23 NOTE — ED Notes (Signed)
Patient arrived on unit calm and cooperative. Patient given meal. Patient is safe on unit with continued monitoring.
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
## 2022-10-23 NOTE — ED Notes (Signed)
Patient complains of restlessness and increase anxiety. Patient medicated per MAR orders.  Respirations equal and unlabored, skin warm and dry, NAD. No change in assessment or acuity. Routine safety checks conducted according to facility protocol. Will continue to monitor for safety.

## 2022-10-24 ENCOUNTER — Other Ambulatory Visit: Payer: Self-pay

## 2022-10-24 DIAGNOSIS — E109 Type 1 diabetes mellitus without complications: Secondary | ICD-10-CM | POA: Diagnosis not present

## 2022-10-24 LAB — URINALYSIS, ROUTINE W REFLEX MICROSCOPIC
Bacteria, UA: NONE SEEN
Bilirubin Urine: NEGATIVE
Glucose, UA: >=500 mg/dL — AB
Hgb urine dipstick: NEGATIVE
Ketones, ur: NEGATIVE mg/dL
Leukocytes,Ua: NEGATIVE
Nitrite: NEGATIVE
Protein, ur: NEGATIVE mg/dL
Specific Gravity, Urine: 1.027 (ref 1.005–1.030)
pH: 5 (ref 5.0–8.0)

## 2022-10-24 LAB — GLUCOSE, CAPILLARY
Glucose-Capillary: 226 mg/dL — ABNORMAL HIGH (ref 70–99)
Glucose-Capillary: 267 mg/dL — ABNORMAL HIGH (ref 70–99)
Glucose-Capillary: 218 mg/dL — ABNORMAL HIGH (ref 70–99)

## 2022-10-24 LAB — PROLACTIN: Prolactin: 5.3 ng/mL (ref 3.6–31.5)

## 2022-10-24 MED ORDER — INSULIN ASPART 100 UNIT/ML IJ SOLN
0.0000 [IU] | INTRAMUSCULAR | Status: DC
Start: 2022-10-24 — End: 2022-10-24

## 2022-10-24 MED ORDER — INSULIN ASPART 100 UNIT/ML IJ SOLN
0.0000 [IU] | INTRAMUSCULAR | Status: DC
  Administered 2022-10-24: 5 [IU] via SUBCUTANEOUS
  Administered 2022-10-24: 11 [IU] via SUBCUTANEOUS
  Administered 2022-10-25 (×2): 2 [IU] via SUBCUTANEOUS
  Administered 2022-10-25: 5 [IU] via SUBCUTANEOUS
  Administered 2022-10-25: 11 [IU] via SUBCUTANEOUS

## 2022-10-24 NOTE — ED Notes (Signed)
Provider states not to give  patient any   muffins, chips, cookies, cakes

## 2022-10-24 NOTE — ED Notes (Signed)
Notified provider of patients blood sugar being 346 . Coverage was given. Patient showing no s/s of hyperglecmia.Will continue to monitor for safety.

## 2022-10-24 NOTE — ED Notes (Signed)
Patient alert and oriented x 3. Denies SI/HI/AVH. Denies intent or plan to harm self or others. Routine conducted according to faculty protocol. Encourage patient to notify staff with any needs or concerns. Patient verbalized agreement and understanding. Will continue to monitor for safety. 

## 2022-10-24 NOTE — ED Notes (Signed)
Patient resting quietly in bed with eyes open, Respirations equal and unlabored, skin warm and dry, NAD. No change in assessment or acuity. Routine safety checks conducted according to facility protocol. Will continue to monitor for safety.   

## 2022-10-24 NOTE — ED Notes (Signed)
Patients mom came up here today patient gave charge nurse permission to tell his mother that he is ok. States not to tell her anything else.Rn was present when he told charge nurse.

## 2022-10-24 NOTE — ED Notes (Signed)
Pt sleeping@this time. Breathing even and unlabored. Will continue to monitor for safety 

## 2022-10-24 NOTE — ED Notes (Signed)
Pt was given chicken and pasta, juice, and a snack for dinner.

## 2022-10-24 NOTE — Progress Notes (Signed)
LCSW Progress Note  [id number]   [place]  10/24/2022  1:08 PM    Inpatient Behavioral Health Placement  Pt meets inpatient criteria per [facility]. There are no available beds within [facility] system per [facility] Antoinette Cillo, RN. Referral was sent to the following facilities;   Destination  Service Provider Address Phone Fax  [facility]  [address] [place] [address] [phone]  [facility] [address]  [address] [place] [address] [phone]  [facility]  [address] [place], [place] [address] [phone]  [facility] [address] [facility]  [address], [place] [address] [phone]  [facility]  [address] [place] [address] [phone]  [facility]  [address] [place] [address] [phone]  [facility]  [address] [place] [address] [phone]  [facility]  [address] [place], [place] [address] [phone]  [facility]  [address] [place] [address] [phone]  [facility]  [address] [place] [address] [phone]  [facility]  [address] [place] [address] [phone]  [facility]  [address] [place] [address] [phone]  [facility]  [address] [place] [address] [phone]  [facility]  [address], [place] [address] [phone]  [facility]  [address] [place] [address] [phone]  [facility]  [address] [place]  [address] [phone]  [facility]  [address] [place] [address] [phone]  [facility]  [address] [place] [address] [phone]  [facility]  [address] [place] [address] [phone]    Situation ongoing,  CSW will follow up.    Maryjean Ka, MSW, LCSWA 10/24/2022 1:08 PM

## 2022-10-24 NOTE — ED Notes (Signed)
Patient  sleeping in no acute stress. RR even and unlabored .Environment secured .Will continue to monitor for safely. 
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
## 2022-10-24 NOTE — ED Notes (Signed)
Notifies provider that patients blood sugar was 267. Patient denies any s/s of hyperglycemia. Will continue to monitor.

## 2022-10-24 NOTE — ED Notes (Signed)
Patients mother came to the facility requesting information about patient. Writer informed her we could not confirm or deny if patient is here without his permission.   Patient gave verbal consent to writer and Durwin Reges RN for writer to let his mother know he is ok, but no other information.   When mother was told patient was ok, she continued to ask for more information, writer reminded her that information could not be given out. Witnesses: [employer] and [employer].

## 2022-10-24 NOTE — ED Notes (Signed)
Pt sleeping@this time. breathing even and unlabored. Will continue to monitor for safety 

## 2022-10-24 NOTE — ED Notes (Signed)
Ok to adjust CBG am time to 0600 from 0400 per [facility] NP.

## 2022-10-25 ENCOUNTER — Other Ambulatory Visit: Payer: Self-pay

## 2022-10-25 ENCOUNTER — Inpatient Hospital Stay (HOSPITAL_COMMUNITY)
Admission: AD | Admit: 2022-10-25 | Discharge: 2022-11-03 | DRG: 885 | Disposition: A | Discharge status: Home / Self Care | Payer: Medicaid Other | Source: Intra-hospital | Attending: Psychiatry | Admitting: Psychiatry

## 2022-10-25 ENCOUNTER — Encounter (HOSPITAL_COMMUNITY): Payer: Self-pay | Admitting: Psychiatry

## 2022-10-25 DIAGNOSIS — Z79899 Other long term (current) drug therapy: Secondary | ICD-10-CM

## 2022-10-25 DIAGNOSIS — Z833 Family history of diabetes mellitus: Secondary | ICD-10-CM | POA: Diagnosis not present

## 2022-10-25 DIAGNOSIS — R278 Other lack of coordination: Secondary | ICD-10-CM

## 2022-10-25 DIAGNOSIS — G4700 Insomnia, unspecified: Secondary | ICD-10-CM | POA: Diagnosis present

## 2022-10-25 DIAGNOSIS — R6250 Unspecified lack of expected normal physiological development in childhood: Secondary | ICD-10-CM

## 2022-10-25 DIAGNOSIS — F909 Attention-deficit hyperactivity disorder, unspecified type: Secondary | ICD-10-CM

## 2022-10-25 DIAGNOSIS — Z794 Long term (current) use of insulin: Secondary | ICD-10-CM

## 2022-10-25 DIAGNOSIS — K5900 Constipation, unspecified: Secondary | ICD-10-CM | POA: Diagnosis present

## 2022-10-25 DIAGNOSIS — F3481 Disruptive mood dysregulation disorder: Secondary | ICD-10-CM

## 2022-10-25 DIAGNOSIS — F840 Autistic disorder: Secondary | ICD-10-CM

## 2022-10-25 DIAGNOSIS — Z818 Family history of other mental and behavioral disorders: Secondary | ICD-10-CM

## 2022-10-25 DIAGNOSIS — F332 Major depressive disorder, recurrent severe without psychotic features: Principal | ICD-10-CM

## 2022-10-25 DIAGNOSIS — Z5986 Financial insecurity: Secondary | ICD-10-CM

## 2022-10-25 DIAGNOSIS — R451 Restlessness and agitation: Secondary | ICD-10-CM | POA: Diagnosis present

## 2022-10-25 DIAGNOSIS — Z9889 Other specified postprocedural states: Secondary | ICD-10-CM

## 2022-10-25 DIAGNOSIS — E109 Type 1 diabetes mellitus without complications: Secondary | ICD-10-CM | POA: Diagnosis not present

## 2022-10-25 DIAGNOSIS — E119 Type 2 diabetes mellitus without complications: Secondary | ICD-10-CM

## 2022-10-25 DIAGNOSIS — R45850 Homicidal ideations: Secondary | ICD-10-CM | POA: Diagnosis present

## 2022-10-25 DIAGNOSIS — F339 Major depressive disorder, recurrent, unspecified: Secondary | ICD-10-CM

## 2022-10-25 DIAGNOSIS — F411 Generalized anxiety disorder: Secondary | ICD-10-CM

## 2022-10-25 DIAGNOSIS — F329 Major depressive disorder, single episode, unspecified: Secondary | ICD-10-CM | POA: Diagnosis present

## 2022-10-25 DIAGNOSIS — R45851 Suicidal ideations: Secondary | ICD-10-CM | POA: Diagnosis present

## 2022-10-25 DIAGNOSIS — F419 Anxiety disorder, unspecified: Secondary | ICD-10-CM | POA: Diagnosis present

## 2022-10-25 DIAGNOSIS — F802 Mixed receptive-expressive language disorder: Secondary | ICD-10-CM

## 2022-10-25 LAB — GLUCOSE, CAPILLARY
Glucose-Capillary: 389 mg/dL — ABNORMAL HIGH (ref 70–99)
Glucose-Capillary: 346 mg/dL — ABNORMAL HIGH (ref 70–99)
Glucose-Capillary: 148 mg/dL — ABNORMAL HIGH (ref 70–99)
Glucose-Capillary: 148 mg/dL — ABNORMAL HIGH (ref 70–99)
Glucose-Capillary: 218 mg/dL — ABNORMAL HIGH (ref 70–99)
Glucose-Capillary: 330 mg/dL — ABNORMAL HIGH (ref 70–99)
Glucose-Capillary: 256 mg/dL — ABNORMAL HIGH (ref 70–99)
Glucose-Capillary: 317 mg/dL — ABNORMAL HIGH (ref 70–99)

## 2022-10-25 MED ORDER — INSULIN ASPART 100 UNIT/ML IJ SOLN
0.0000 [IU] | Freq: Every day | INTRAMUSCULAR | Status: DC
  Administered 2022-10-25: 4 [IU] via SUBCUTANEOUS
  Administered 2022-10-26 – 2022-10-27 (×2): 3 [IU] via SUBCUTANEOUS
  Administered 2022-10-28: 4 [IU] via SUBCUTANEOUS
  Administered 2022-10-29: 2 [IU] via SUBCUTANEOUS
  Administered 2022-10-30: 3 [IU] via SUBCUTANEOUS
  Administered 2022-11-01: 2 [IU] via SUBCUTANEOUS
  Administered 2022-11-02: 3 [IU] via SUBCUTANEOUS

## 2022-10-25 MED ORDER — ALUM & MAG HYDROXIDE-SIMETH 200-200-20 MG/5ML PO SUSP
30.0000 mL | ORAL | Status: DC | PRN
Start: 2022-10-25 — End: 2022-10-25

## 2022-10-25 MED ORDER — INSULIN ASPART 100 UNIT/ML IJ SOLN: 0.0000 [IU] | Freq: Three times a day (TID) | INTRAMUSCULAR | Status: DC

## 2022-10-25 MED ORDER — ACETAMINOPHEN 325 MG PO TABS
650.0000 mg | ORAL_TABLET | Freq: Four times a day (QID) | ORAL | Status: DC | PRN
  Administered 2022-11-01: 650 mg via ORAL
  Filled 2022-10-25: qty 2

## 2022-10-25 MED ORDER — INSULIN ASPART 100 UNIT/ML IJ SOLN
0.0000 [IU] | Freq: Three times a day (TID) | INTRAMUSCULAR | Status: DC
  Administered 2022-10-26 (×2): 11 [IU] via SUBCUTANEOUS
  Administered 2022-10-26: 8 [IU] via SUBCUTANEOUS
  Administered 2022-10-27: 11 [IU] via SUBCUTANEOUS
  Administered 2022-10-27: 3 [IU] via SUBCUTANEOUS
  Administered 2022-10-27 – 2022-10-28 (×3): 8 [IU] via SUBCUTANEOUS
  Administered 2022-10-28: 3 [IU] via SUBCUTANEOUS
  Administered 2022-10-29 – 2022-10-30 (×4): 5 [IU] via SUBCUTANEOUS
  Administered 2022-10-30 (×2): 3 [IU] via SUBCUTANEOUS
  Administered 2022-10-31: 5 [IU] via SUBCUTANEOUS
  Administered 2022-10-31: 3 [IU] via SUBCUTANEOUS
  Administered 2022-11-01: 8 [IU] via SUBCUTANEOUS
  Administered 2022-11-01: 5 [IU] via SUBCUTANEOUS
  Administered 2022-11-01: 8 [IU] via SUBCUTANEOUS
  Administered 2022-11-02: 5 [IU] via SUBCUTANEOUS
  Administered 2022-11-02: 8 [IU] via SUBCUTANEOUS
  Administered 2022-11-02: 3 [IU] via SUBCUTANEOUS
  Administered 2022-11-03: 5 [IU] via SUBCUTANEOUS

## 2022-10-25 MED ORDER — FLUOXETINE HCL 20 MG PO CAPS
20.0000 mg | ORAL_CAPSULE | Freq: Every day | ORAL | Status: DC
  Administered 2022-10-26: 20 mg via ORAL
  Filled 2022-10-25 (×3): qty 1

## 2022-10-25 MED ORDER — INSULIN GLARGINE-YFGN 100 UNIT/ML ~~LOC~~ SOLN
20.0000 [IU] | Freq: Every day | SUBCUTANEOUS | Status: DC
  Administered 2022-10-26 – 2022-10-28 (×3): 20 [IU] via SUBCUTANEOUS

## 2022-10-25 MED ORDER — ALUM & MAG HYDROXIDE-SIMETH 200-200-20 MG/5ML PO SUSP: 30.0000 mL | ORAL | Status: DC | PRN

## 2022-10-25 MED ORDER — MAGNESIUM HYDROXIDE 400 MG/5ML PO SUSP: 30.0000 mL | Freq: Every day | ORAL | Status: DC | PRN

## 2022-10-25 MED ORDER — TRAZODONE HCL 50 MG PO TABS
50.0000 mg | ORAL_TABLET | Freq: Every evening | ORAL | Status: DC | PRN
  Administered 2022-10-25 – 2022-10-26 (×2): 50 mg via ORAL
  Filled 2022-10-25 (×2): qty 1

## 2022-10-25 MED ORDER — ACETAMINOPHEN 325 MG PO TABS: 650.0000 mg | ORAL_TABLET | Freq: Four times a day (QID) | ORAL | Status: DC | PRN
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
## 2022-10-25 NOTE — ED Notes (Signed)
Patient resting quietly in bed with eyes open, Respirations equal and unlabored, skin warm and dry, NAD. No change in assessment or acuity. Routine safety checks conducted according to facility protocol. Will continue to monitor for safety.   

## 2022-10-25 NOTE — ED Notes (Signed)
RN informed that patients F.S. = 333.  Writer spoke to provider who advised that patient receive insulin coverage of 11 units.  Patient will then go to [facility] as bed is ready.  Endorsed this information to assigned RN Regan Lemming.

## 2022-10-25 NOTE — Discharge Instructions (Addendum)
Accepted [facility] unit

## 2022-10-25 NOTE — ED Notes (Signed)
[facility] arrived to take Jeffrey Delgado to [facility]. He received his personal items and the necessary paperwork.
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
## 2022-10-25 NOTE — ED Notes (Signed)
Patient was given lunch. 

## 2022-10-25 NOTE — Tx Team (Signed)
Initial Treatment Plan 10/25/2022 5:10 PM Jeffrey Delgado ZOX:[id number]    PATIENT STRESSORS: Health problems   Marital or family conflict   Traumatic event     PATIENT STRENGTHS: Ability for insight  Average or above average intelligence  Communication skills    PATIENT IDENTIFIED PROBLEMS: "Help resolve my past issues"  "To change my erratic behaviors"  Depression  Anxiety  Suicidal thoughts  Ineffective coping skills           DISCHARGE CRITERIA:  Ability to meet basic life and health needs Adequate post-discharge living arrangements  PRELIMINARY DISCHARGE PLAN: Attend aftercare/continuing care group Outpatient therapy Return to previous living arrangement  PATIENT/FAMILY INVOLVEMENT: This treatment plan has been presented to and reviewed with the patient, Jeffrey Delgado, and/or family member.  The patient and family have been given the opportunity to ask questions and make suggestions.  Clarene Critchley, RN 10/25/2022, 5:10 PM

## 2022-10-25 NOTE — Progress Notes (Signed)
Pt was accepted to [facility] TODAY5/20/2024; Bed Assignment 305-1 PENDING Signed Voluntary consent Faxed to [facility] [phone]  Pt meets inpatient criteria per Hillery Jacks, NP  Attending Physician will be Dr. Phineas Inches, MD  Report can be called to: - Adult unit: [phone]  Pt can arrive after: PENDING Items [facility] AC will coordinate with care team.  Care Team notified: Day [facility] Rona Ravens, RN, Hillery Jacks, NP, Seria McLaughlin,RN, Ireti Castor, LCSWA 10/25/2022 @ 12:29 PM

## 2022-10-25 NOTE — ED Notes (Signed)
Pt is currently sleeping, no distress noted, environmental check complete, will continue to monitor patient for safety.  

## 2022-10-26 DIAGNOSIS — F332 Major depressive disorder, recurrent severe without psychotic features: Secondary | ICD-10-CM

## 2022-10-26 LAB — GLUCOSE, CAPILLARY
Glucose-Capillary: 257 mg/dL — ABNORMAL HIGH (ref 70–99)
Glucose-Capillary: 316 mg/dL — ABNORMAL HIGH (ref 70–99)
Glucose-Capillary: 324 mg/dL — ABNORMAL HIGH (ref 70–99)
Glucose-Capillary: 282 mg/dL — ABNORMAL HIGH (ref 70–99)

## 2022-10-26 MED ORDER — MELATONIN 3 MG PO TABS
3.0000 mg | ORAL_TABLET | Freq: Every day | ORAL | Status: DC
  Administered 2022-10-26: 3 mg via ORAL
  Filled 2022-10-26 (×4): qty 1

## 2022-10-26 MED ORDER — ARIPIPRAZOLE 2 MG PO TABS
2.0000 mg | ORAL_TABLET | Freq: Every day | ORAL | Status: DC
  Administered 2022-10-26 – 2022-10-31 (×6): 2 mg via ORAL
  Filled 2022-10-26 (×9): qty 1

## 2022-10-26 MED ORDER — HALOPERIDOL 1 MG PO TABS: 2.0000 mg | ORAL_TABLET | Freq: Three times a day (TID) | ORAL | Status: DC | PRN

## 2022-10-26 MED ORDER — DIPHENHYDRAMINE HCL 25 MG PO CAPS: 50.0000 mg | ORAL_CAPSULE | Freq: Three times a day (TID) | ORAL | Status: DC | PRN

## 2022-10-26 MED ORDER — HALOPERIDOL 5 MG PO TABS: 5.0000 mg | ORAL_TABLET | Freq: Three times a day (TID) | ORAL | Status: DC | PRN

## 2022-10-26 MED ORDER — HALOPERIDOL LACTATE 5 MG/ML IJ SOLN: 2.0000 mg | Freq: Three times a day (TID) | INTRAMUSCULAR | Status: DC | PRN

## 2022-10-26 MED ORDER — HALOPERIDOL LACTATE 5 MG/ML IJ SOLN: 5.0000 mg | Freq: Three times a day (TID) | INTRAMUSCULAR | Status: DC | PRN

## 2022-10-26 MED ORDER — DIPHENHYDRAMINE HCL 50 MG/ML IJ SOLN: 50.0000 mg | Freq: Four times a day (QID) | INTRAMUSCULAR | Status: DC | PRN

## 2022-10-26 MED ORDER — FLUOXETINE HCL 20 MG PO CAPS
40.0000 mg | ORAL_CAPSULE | Freq: Every day | ORAL | Status: DC
  Administered 2022-10-27: 40 mg via ORAL
  Filled 2022-10-26 (×3): qty 2

## 2022-10-26 NOTE — Plan of Care (Signed)
  Problem: Coping: Goal: Ability to demonstrate self-control will improve Outcome: Progressing   Problem: Health Behavior/Discharge Planning: Goal: Compliance with treatment plan for underlying cause of condition will improve Outcome: Progressing   Problem: Coping: Goal: Will verbalize feelings Outcome: Progressing   

## 2022-10-26 NOTE — [facility] Suicide Risk Assessment (Signed)
[facility] INPATIENT:  Family/Significant Other Suicide Prevention Education  Suicide Prevention Education:  Education Completed; 10-26-2022, -(mom) Jeffrey Delgado [phone] has been identified by the patient as the family member/significant other with whom the patient will be residing, and identified as the person(s) who will aid the patient in the event of a mental health crisis (suicidal ideations/suicide attempt).  With written consent from the patient, the family member/significant other has been provided the following suicide prevention education, prior to the and/or following the discharge of the patient.  The suicide prevention education provided includes the following: Suicide risk factors Suicide prevention and interventions National Suicide Hotline telephone number [facility] assessment telephone number [facility] Emergency Assistance 911 [facility] and/or Residential Mobile Crisis Unit telephone number  Request made of family/significant other to: Remove weapons (e.g., guns, rifles, knives), all items previously/currently identified as safety concern.   Remove drugs/medications (over-the-counter, prescriptions, illicit drugs), all items previously/currently identified as a safety concern. .   to remove the items of safety concern listed above.  Jeffrey Delgado S Astin Rape 10/26/2022, 3:10 PM
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
## 2022-10-26 NOTE — [facility] Suicide Risk Assessment (Signed)
Suicide Risk Assessment  Admission Assessment    [facility] Admission Suicide Risk Assessment   Nursing information obtained from:  Patient Demographic factors:  Male, Caucasian Current Mental Status:  Self-harm thoughts Loss Factors:  Loss of significant relationship, Legal issues Historical Factors:  Impulsivity Risk Reduction Factors:  Living with another person, especially a relative  Total Time spent with patient: 30 minutes Principal Problem: MDD (major depressive disorder) Diagnosis:  Principal Problem:   MDD (major depressive disorder)  Subjective Data:  Jeffrey Delgado is a 22 year old male with prior history of psychiatric diagnoses significant for major depressive disorder, ADHD, history of autism spectrum, who presents voluntarily to [facility] from [facility] for worsening depression with recurrent suicidal thought with plan to self-harm and end his life and not wake up in the context of break-up with girlfriend.   Continued Clinical Symptoms:    The "Alcohol Use Disorders Identification Test", Guidelines for Use in Primary Care, Second Edition.  World Science writer [facility]). Score between 0-7:  no or low risk or alcohol related problems. Score between 8-15:  moderate risk of alcohol related problems. Score between 16-19:  high risk of alcohol related problems. Score 20 or above:  warrants further diagnostic evaluation for alcohol dependence and treatment.   CLINICAL FACTORS:   Depression:   Aggression Anhedonia Hopelessness Insomnia Severe More than one psychiatric diagnosis Previous Psychiatric Diagnoses and Treatments Medical Diagnoses and Treatments/Surgeries   Musculoskeletal: Strength & Muscle Tone: within normal limits Gait & Station: normal Patient leans: N/A  Psychiatric Specialty Exam:  Presentation  General Appearance:  Other (comment) (Multiple Tattoo)  Eye Contact: Minimal  Speech: Clear and Coherent  Speech  Volume: Normal  Handedness: Right   Mood and Affect  Mood: Dysphoric  Affect: Blunt   Thought Process  Thought Processes: Linear  Descriptions of Associations:Intact  Orientation:Full (Time, Place and Person)  Thought Content:Logical  History of Schizophrenia/Schizoaffective disorder:No  Duration of Psychotic Symptoms:No data recorded Hallucinations:No data recorded Ideas of Reference:None  Suicidal Thoughts:No data recorded Homicidal Thoughts:No data recorded  Sensorium  Memory: Immediate Good; Recent Good  Judgment: Fair  Insight: Fair   Art therapist  Concentration: Fair  Attention Span: Fair  Recall: [employer] of Knowledge: Fair  Language: Fair   Psychomotor Activity  Psychomotor Activity:No data recorded  Assets  Assets: Communication Skills; Desire for Improvement; Resilience   Sleep  Sleep:No data recorded   Physical Exam: Physical Exam Vitals and nursing note reviewed.  HENT:     Head: Normocephalic.     Nose: Nose normal.     Mouth/Throat:     Mouth: Mucous membranes are moist.  Eyes:     Pupils: Pupils are equal, round, and reactive to light.  Cardiovascular:     Rate and Rhythm: Normal rate.     Pulses: Normal pulses.  Pulmonary:     Effort: Pulmonary effort is normal.  Abdominal:     Comments: deferred  Genitourinary:    Comments: deferred Musculoskeletal:        General: Normal range of motion.     Cervical back: Normal range of motion.  Skin:    General: Skin is warm.  Neurological:     General: No focal deficit present.     Mental Status: He is alert and oriented to person, place, and time.  Psychiatric:     Comments: Nihilistic     Review of Systems  Constitutional:  Negative for fever.  Eyes:  Positive for  blurred vision.       Patient wears contact lenses due to blurry vision  Respiratory:  Negative for shortness of breath.   Cardiovascular: Negative.   Gastrointestinal:  Negative  for nausea and vomiting.  Genitourinary: Negative.   Musculoskeletal: Negative.   Skin: Negative.   Neurological:  Positive for seizures (Has history of seizures at a young age).  Endo/Heme/Allergies:        See allergy listing  Psychiatric/Behavioral:  Positive for depression and suicidal ideas. The patient is nervous/anxious and has insomnia.    Blood pressure 112/62, pulse 99, temperature 98.5 F (36.9 C), temperature source Oral, resp. rate 20, height 5\' 6"  (1.676 m), weight 59.9 kg, SpO2 98 %. Body mass index is 21.31 kg/m.   COGNITIVE FEATURES THAT CONTRIBUTE TO RISK:  Polarized thinking    SUICIDE RISK:   Severe:  Frequent, intense, and enduring suicidal ideation, specific plan, no subjective intent, but some objective markers of intent (i.e., choice of lethal method), the method is accessible, some limited preparatory behavior, evidence of impaired self-control, severe dysphoria/symptomatology, multiple risk factors present, and few if any protective factors, particularly a lack of social support.  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 daily for depression Continue hydroxyzine 25 mg p.o. 3 times daily as needed anxiety Initiated melatonin 3 mg p.o. nightly for insomnia as needed Initiate Abilify 2 mg p.o. daily to augment  antidepressant/mood stabilization   Agitation protocol: Benadryl capsule 50 mg p.o. 3 times daily as needed agitation or Benadryl injection 50 mg IM 3 times daily as needed agitation   Haldol tablets 5 mg po 3 times daily as needed agitation or Haldol lactate injection 5 mg IM 3 times daily as needed agitation   Other PRN Medications -Acetaminophen 650 mg every 6 as needed/mild pain -Maalox 30 mL oral every 4 as needed/digestion -Magnesium hydroxide 30 mL daily as needed/mild constipation   Safety and Monitoring: Voluntary admission to inpatient psychiatric unit for safety, stabilization and treatment Daily contact with patient to assess and evaluate symptoms and progress in treatment Patient's case to be discussed in multi-disciplinary team meeting Observation Level : q15 minute checks Vital signs: q12 hours Precautions: suicide, but pt currently verbally contracts for safety on unit    Discharge Planning: Social work and case management to assist with discharge planning and identification of hospital follow-up needs prior to discharge Estimated LOS: 5-7 days Discharge Concerns: Need to establish a safety plan; Medication compliance and effectiveness Discharge Goals: Return home with outpatient referrals for mental health follow-up including medication management/psychotherapy.   Long Term Goal(s): Improvement in symptoms so as ready for discharge   Short Term Goals: Ability to identify changes in lifestyle to reduce recurrence of condition will improve, Ability to verbalize feelings will improve, Ability to disclose and discuss suicidal ideas, Ability to demonstrate self-control will improve, Ability to identify and develop effective coping behaviors will improve, Ability to maintain clinical  measurements within normal limits will improve, Compliance with prescribed medications will improve, and Ability to identify triggers associated with substance abuse/mental health issues will  improve   Physician Treatment Plan for Secondary Diagnosis: Principal Problem:   MDD (major depressive disorder)  I certify that inpatient services furnished can reasonably be expected to improve the patient's condition.   Cecilie Lowers, FNP 10/26/2022, 3:50 PM

## 2022-10-26 NOTE — Group Note (Signed)
Recreation Therapy Group Note   Group Topic:Animal Assisted Therapy   Group Date: 10/26/2022 Start Time: 0945 End Time: 1030 Facilitators: Staphany Ditton, Benito Mccreedy, LRT   Animal-Assisted Activity (AAA) Program Checklist/Progress Note Patient Eligibility Criteria Checklist & Daily Group note for Rec Tx Intervention   AAA/T Program Assumption of Risk Form signed by Patient/ or Parent Legal Guardian YES  Patient understands their participation is voluntary YES   Group Description: Patients provided opportunity to interact with trained and credentialed [employer] Therapy dog and the community volunteer/dog handler.    Affect/Mood: N/A   Participation Level: Did not attend    Clinical Observations/Individualized Feedback: Pt declined participation in AAA programming offered.    Benito Mccreedy Verdell Kincannon, LRT, CTRS 10/26/2022 4:11 PM

## 2022-10-26 NOTE — Group Note (Signed)
Date:  10/26/2022 Time:  4:47 PM  Group Topic/Focus:  Goals Group:   The focus of this group is to help patients establish daily goals to achieve during treatment and discuss how the patient can incorporate goal setting into their daily lives to aide in recovery. Orientation:   The focus of this group is to educate the patient on the purpose and policies of crisis stabilization and provide a format to answer questions about their admission.  The group details unit policies and expectations of patients while admitted.    Participation Level:  Active  Participation Quality:  Appropriate  Affect:  Appropriate  Cognitive:  Appropriate  Insight: Appropriate  Engagement in Group:  Engaged  Modes of Intervention:  Discussion and Orientation  Additional Comments:   Pt attended and actively participated in the Orientation/Goals group.  Edmund Hilda Deveon Kisiel 10/26/2022, 4:47 PM

## 2022-10-26 NOTE — Progress Notes (Signed)
D- Patient alert and oriented. Denies SI, HI, AVH, and pain.Patient endorses depression and sadness. Eye contact brief and affect flat.   A- Scheduled medications administered to patient along with PRN trazodone, per MD orders. Support and encouragement provided.  Routine safety checks conducted every 15 minutes.  Patient informed to notify staff with problems or concerns.  R- No adverse drug reactions noted. Patient contracts for safety at this time. Patient compliant with medications and treatment plan. Patient receptive, calm, and cooperative. Patient interacts well with others on the unit.  Patient remains safe at this time.

## 2022-10-26 NOTE — Progress Notes (Signed)
   10/25/22 2025  Psych Admission Type (Psych Patients Only)  Admission Status Voluntary  Psychosocial Assessment  Patient Complaints Depression;Hopelessness  Eye Contact Fair  Facial Expression Flat  Affect Flat  Speech Logical/coherent  Interaction No initiation;Minimal  Motor Activity Slow  Appearance/Hygiene In scrubs  Behavior Characteristics Cooperative  Mood Depressed;Sullen;Sad  Thought Process  Coherency WDL  Content WDL  Delusions None reported or observed  Perception WDL  Hallucination None reported or observed  Judgment Impaired  Confusion None  Danger to Self  Current suicidal ideation? Denies  Self-Injurious Behavior No self-injurious ideation or behavior indicators observed or expressed   Agreement Not to Harm Self Yes  Description of Agreement Verbally contracts for safety.   Patient alert and oriented. Presenting with a flat affect and depressed, sullen, sad mood. Patient denies SI, HI, AVH, and pain. Scheduled insulin administered to patient, per provider orders. PRN trazodone administered, per provider orders. Support and encouragement provided. Routine safety checks conducted every 15 minutes. Patient verbally contracts for safety and remains safe on the unit.

## 2022-10-26 NOTE — Progress Notes (Addendum)
D. Pt presented depressed- poor eye contact- observed sitting on the floor in the corner of his room in the dark upon initial approach this morning. Per pt's self inventory, pt rated his depression,hopelessness and anxiety a 10/10/0, respectively. Pt's stated goal today is "to be able to feel again and heal from my past till this day, " and stated he would accomplish this by "being alone in the dark." Later, pt was visible in the milieu attending groups, and was observed interacting with a peer in the hallway. Pt endorses passive SI, no plan, and verbally contracts for safety. Pt denied A/VH and does not appear to be responding to internal stimuli. A. Labs and vitals monitored. Pt given and educated on medications. Pt supported emotionally and encouraged to express concerns and ask questions.   R. Pt remains safe with 15 minute checks. Will continue POC.    10/26/22 1200  Psych Admission Type (Psych Patients Only)  Admission Status Voluntary  Psychosocial Assessment  Patient Complaints Hopelessness;Depression  Eye Contact Fair  Facial Expression Flat  Affect Flat  Speech Logical/coherent  Interaction No initiation  Motor Activity Slow  Appearance/Hygiene Unremarkable  Behavior Characteristics Cooperative  Mood Depressed  Thought Process  Coherency WDL  Content WDL  Delusions None reported or observed  Perception WDL  Hallucination None reported or observed  Judgment Impaired  Confusion None  Danger to Self  Current suicidal ideation? Denies  Self-Injurious Behavior No self-injurious ideation or behavior indicators observed or expressed   Agreement Not to Harm Self Yes  Description of Agreement verbal contract for safety  Danger to Others  Danger to Others None reported or observed
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
## 2022-10-26 NOTE — Group Note (Signed)
LCSW Group Therapy Note  Group Date: 10/26/2022 Start Time: 1100 End Time: 1200   Type of Therapy and Topic:  Group Therapy - How To Cope with Nervousness about Discharge   Participation Level:  Minimal   Description of Group This process group involved identification of patients' feelings about discharge. Some of them are scheduled to be discharged soon, while others are new admissions, but each of them was asked to share thoughts and feelings surrounding discharge from the hospital. One common theme was that they are excited at the prospect of going home, while another was that many of them are apprehensive about sharing why they were hospitalized. Patients were given the opportunity to discuss these feelings with their peers in preparation for discharge.  Therapeutic Goals  Patient will identify their overall feelings about pending discharge. Patient will think about how they might proactively address issues that they believe will once again arise once they get home (i.e. with parents). Patients will participate in discussion about having hope for change.   Summary of Patient Progress:  He was not very active throughout the session.    Therapeutic Modalities Cognitive Behavioral Therapy   Marinda Elk, Theresia Majors 10/26/2022  12:04 PM

## 2022-10-26 NOTE — [facility] Group Notes (Signed)
[facility] Group Notes:  (Nursing/MHT/Case Management/Adjunct)  Date:  10/26/2022  Time:  4:06 AM  Type of Therapy:   AA group  Participation Level:  Did Not Attend  Participation Quality:    Affect:    Cognitive:    Insight:    Engagement in Group:    Modes of Intervention:    Summary of Progress/Problems: didn't attend.  Noah Delaine 10/26/2022, 4:06 AM

## 2022-10-27 ENCOUNTER — Encounter (HOSPITAL_COMMUNITY): Payer: Self-pay

## 2022-10-27 DIAGNOSIS — F332 Major depressive disorder, recurrent severe without psychotic features: Secondary | ICD-10-CM | POA: Diagnosis not present

## 2022-10-27 LAB — GLUCOSE, CAPILLARY
Glucose-Capillary: 297 mg/dL — ABNORMAL HIGH (ref 70–99)
Glucose-Capillary: 348 mg/dL — ABNORMAL HIGH (ref 70–99)
Glucose-Capillary: 200 mg/dL — ABNORMAL HIGH (ref 70–99)
Glucose-Capillary: 280 mg/dL — ABNORMAL HIGH (ref 70–99)

## 2022-10-27 MED ORDER — ONDANSETRON 4 MG PO TBDP
4.0000 mg | ORAL_TABLET | Freq: Three times a day (TID) | ORAL | Status: DC | PRN
  Administered 2022-10-27: 4 mg via ORAL
  Filled 2022-10-27: qty 1

## 2022-10-27 MED ORDER — FLUOXETINE HCL 20 MG PO CAPS
20.0000 mg | ORAL_CAPSULE | Freq: Every day | ORAL | Status: DC
  Administered 2022-10-28 – 2022-10-30 (×3): 20 mg via ORAL
  Filled 2022-10-27 (×5): qty 1

## 2022-10-27 MED ORDER — MELATONIN 5 MG PO TABS
5.0000 mg | ORAL_TABLET | Freq: Every day | ORAL | Status: DC
  Administered 2022-10-27: 5 mg via ORAL
  Filled 2022-10-27 (×3): qty 1

## 2022-10-27 NOTE — Plan of Care (Signed)
  Problem: Education: Goal: Knowledge of [facility] Education information/materials will improve Outcome: Progressing   Problem: Education: Goal: Emotional status will improve Outcome: Progressing   

## 2022-10-27 NOTE — Inpatient Diabetes Management (Signed)
Inpatient Diabetes Program Recommendations  AACE/ADA: New Consensus Statement on Inpatient Glycemic Control (2015)  Target Ranges:  Prepandial:   less than 140 mg/dL      Peak postprandial:   less than 180 mg/dL (1-2 hours)      Critically ill patients:  140 - 180 mg/dL   Lab Results  Component Value Date   GLUCAP 297 (H) 10/27/2022   HGBA1C 7.0 (H) 10/23/2022    Review of Glycemic Control  Diabetes history: DM1 Outpatient Diabetes medications: Novolog 45 units QD, Lantus 20 QD, has Libre CGM Current orders for Inpatient glycemic control: Semglee 20 units QD, Novolog 0-15 TID with meals and 0-5 HS  HgbA1C - 7%  Inpatient Diabetes Program Recommendations:    Consider increasing Semglee to 22 units QD  Consider adding Novolog 6 units TID with meals if eating > 50%  Continue to follow.  Thank you. Ailene Ards, RD, LDN, CDCES Inpatient Diabetes Coordinator [phone]

## 2022-10-27 NOTE — [facility] Group Notes (Signed)
Spiritual care group on grief and loss facilitated by chaplain Dyanne Carrel, [facility]  Group Goal:  Support / Education around grief and loss  Members engage in facilitated group support and psycho-social education.  Group Description:  Following introductions and group rules, group members engaged in facilitated group dialog and support around topic of loss, with particular support around experiences of loss in their lives. Group Identified types of loss (relationships / self / things) and identified patterns, circumstances, and changes that precipitate losses. Reflected on thoughts / feelings around loss, normalized grief responses, and recognized variety in grief experience. Group noted Worden's four tasks of grief in discussion.  Group drew on Adlerian / Rogerian, narrative, MI,  Patient Progress: [name] attended group and demonstrated engagement throughout the session.  He requested to meet individually and chaplain will plan to follow up with him tomorrow.

## 2022-10-27 NOTE — Progress Notes (Signed)
   10/27/22 1300  Psych Admission Type (Psych Patients Only)  Admission Status Voluntary  Psychosocial Assessment  Patient Complaints Hopelessness;Depression  Eye Contact Brief  Facial Expression Flat  Affect Flat  Speech Logical/coherent  Interaction Minimal  Motor Activity Slow  Appearance/Hygiene Unremarkable  Behavior Characteristics Cooperative  Mood Depressed;Sad  Thought Process  Coherency WDL  Content WDL  Delusions None reported or observed  Perception WDL  Hallucination None reported or observed  Judgment Limited  Confusion None  Danger to Self  Current suicidal ideation? Denies  Agreement Not to Harm Self Yes  Description of Agreement verbal  Danger to Others  Danger to Others None reported or observed

## 2022-10-27 NOTE — BH IP Treatment Plan (Signed)
Interdisciplinary Treatment and Diagnostic Plan Update  10/27/2022 Time of Session: 10:43 AM  Jeffrey Delgado MRN: [id number]  Principal Diagnosis: MDD (major depressive disorder)  Secondary Diagnoses: Principal Problem:   MDD (major depressive disorder)   Current Medications:  Current Facility-Administered Medications  Medication Dose Route Frequency Provider Last Rate Last Admin   acetaminophen (TYLENOL) tablet 650 mg  650 mg Oral Q6H PRN Oneta Rack, NP       alum & mag hydroxide-simeth (MAALOX/MYLANTA) 200-200-20 MG/5ML suspension 30 mL  30 mL Oral Q4H PRN Oneta Rack, NP       ARIPiprazole (ABILIFY) tablet 2 mg  2 mg Oral Daily Ntuen, Jesusita Oka, FNP   2 mg at 10/27/22 6213   diphenhydrAMINE (BENADRYL) capsule 50 mg  50 mg Oral Q8H PRN Ntuen, Jesusita Oka, FNP       Or   diphenhydrAMINE (BENADRYL) injection 50 mg  50 mg Intramuscular Q6H PRN Ntuen, Jesusita Oka, FNP       FLUoxetine (PROZAC) capsule 40 mg  40 mg Oral Daily Ntuen, Tina C, FNP   40 mg at 10/27/22 0865   haloperidol (HALDOL) tablet 5 mg  5 mg Oral Q8H PRN Ntuen, Jesusita Oka, FNP       Or   haloperidol lactate (HALDOL) injection 5 mg  5 mg Intramuscular Q8H PRN Ntuen, Tina C, FNP       insulin aspart (novoLOG) injection 0-15 Units  0-15 Units Subcutaneous TID WC Onuoha, Chinwendu V, NP   8 Units at 10/27/22 0718   insulin aspart (novoLOG) injection 0-5 Units  0-5 Units Subcutaneous QHS Onuoha, Chinwendu V, NP   3 Units at 10/26/22 2135   insulin glargine-yfgn (SEMGLEE) injection 20 Units  20 Units Subcutaneous Q breakfast Oneta Rack, NP   20 Units at 10/27/22 7846   magnesium hydroxide (MILK OF MAGNESIA) suspension 30 mL  30 mL Oral Daily PRN Oneta Rack, NP       melatonin tablet 3 mg  3 mg Oral QHS Ntuen, Tina C, FNP   3 mg at 10/26/22 2136   ondansetron (ZOFRAN-ODT) disintegrating tablet 4 mg  4 mg Oral Q8H PRN Cecilie Lowers, FNP   4 mg at 10/27/22 0843   traZODone (DESYREL) tablet 50 mg  50 mg Oral QHS PRN Oneta Rack, NP   50 mg at 10/26/22 2136   PTA Medications: Medications Prior to Admission  Medication Sig Dispense Refill Last Dose   Continuous Glucose Sensor (DEXCOM G7 SENSOR) MISC 1 each by Does not apply route daily as needed. Continous Glucose Meter      insulin aspart (NOVOLOG) 100 UNIT/ML injection Inject 45 Units into the skin daily. Patient uses up to 45 units per day based on carb intake and BG levels      insulin glargine (LANTUS) 100 UNIT/ML injection Inject 20 Units into the skin daily.       Patient Stressors: Health problems   Marital or family conflict   Traumatic event    Patient Strengths: Ability for insight  Average or above average intelligence  Communication skills   Treatment Modalities: Medication Management, Group therapy, Case management,  1 to 1 session with clinician, Psychoeducation, Recreational therapy.   Physician Treatment Plan for Primary Diagnosis: MDD (major depressive disorder) Long Term Goal(s): Improvement in symptoms so as ready for discharge   Short Term Goals: Ability to identify changes in lifestyle to reduce recurrence of condition will improve Ability to verbalize feelings will  improve Ability to disclose and discuss suicidal ideas Ability to demonstrate self-control will improve Ability to identify and develop effective coping behaviors will improve Ability to maintain clinical measurements within normal limits will improve Compliance with prescribed medications will improve Ability to identify triggers associated with substance abuse/mental health issues will improve  Medication Management: Evaluate patient's response, side effects, and tolerance of medication regimen.  Therapeutic Interventions: 1 to 1 sessions, Unit Group sessions and Medication administration.  Evaluation of Outcomes: Not Progressing  Physician Treatment Plan for Secondary Diagnosis: Principal Problem:   MDD (major depressive disorder)  Long Term Goal(s):  Improvement in symptoms so as ready for discharge   Short Term Goals: Ability to identify changes in lifestyle to reduce recurrence of condition will improve Ability to verbalize feelings will improve Ability to disclose and discuss suicidal ideas Ability to demonstrate self-control will improve Ability to identify and develop effective coping behaviors will improve Ability to maintain clinical measurements within normal limits will improve Compliance with prescribed medications will improve Ability to identify triggers associated with substance abuse/mental health issues will improve     Medication Management: Evaluate patient's response, side effects, and tolerance of medication regimen.  Therapeutic Interventions: 1 to 1 sessions, Unit Group sessions and Medication administration.  Evaluation of Outcomes: Not Progressing   RN Treatment Plan for Primary Diagnosis: MDD (major depressive disorder) Long Term Goal(s): Knowledge of disease and therapeutic regimen to maintain health will improve  Short Term Goals: Ability to remain free from injury will improve, Ability to verbalize frustration and anger appropriately will improve, Ability to demonstrate self-control, Ability to participate in decision making will improve, Ability to verbalize feelings will improve, Ability to disclose and discuss suicidal ideas, Ability to identify and develop effective coping behaviors will improve, and Compliance with prescribed medications will improve  Medication Management: RN will administer medications as ordered by provider, will assess and evaluate patient's response and provide education to patient for prescribed medication. RN will report any adverse and/or side effects to prescribing provider.  Therapeutic Interventions: 1 on 1 counseling sessions, Psychoeducation, Medication administration, Evaluate responses to treatment, Monitor vital signs and CBGs as ordered, Perform/monitor CIWA, COWS, AIMS  and Fall Risk screenings as ordered, Perform wound care treatments as ordered.  Evaluation of Outcomes: Not Progressing   LCSW Treatment Plan for Primary Diagnosis: MDD (major depressive disorder) Long Term Goal(s): Safe transition to appropriate next level of care at discharge, Engage patient in therapeutic group addressing interpersonal concerns.  Short Term Goals: Engage patient in aftercare planning with referrals and resources, Increase social support, Increase ability to appropriately verbalize feelings, Increase emotional regulation, Facilitate acceptance of mental health diagnosis and concerns, Facilitate patient progression through stages of change regarding substance use diagnoses and concerns, Identify triggers associated with mental health/substance abuse issues, and Increase skills for wellness and recovery  Therapeutic Interventions: Assess for all discharge needs, 1 to 1 time with Social worker, Explore available resources and support systems, Assess for adequacy in community support network, Educate family and significant other(s) on suicide prevention, Complete Psychosocial Assessment, Interpersonal group therapy.  Evaluation of Outcomes: Not Progressing   Progress in Treatment: Attending groups: No. Participating in groups: No. Taking medication as prescribed: Yes. Toleration medication: Yes. Family/Significant other contact made: Yes, individual(s) contacted:  (mom) Carroll Sage [phone] Patient understands diagnosis: Yes. Discussing patient identified problems/goals with staff: Yes. Medical problems stabilized or resolved: Yes. Denies suicidal/homicidal ideation: Yes. Issues/concerns per patient self-inventory: No.   New problem(s) identified: No, Describe:  None reported   New Short Term/Long Term Goal(s):medication stabilization, elimination of SI thoughts, development of comprehensive mental wellness plan.    Patient Goals:  " get on medications for  my depression and feel emotions again "   Discharge Plan or Barriers: Patient recently admitted. CSW will continue to follow and assess for appropriate referrals and possible discharge planning.    Reason for Continuation of Hospitalization: Anxiety Depression Medication stabilization Suicidal ideation  Estimated Length of Stay: 3-5 days   Last 3 [place] Suicide Severity Risk Score: Flowsheet Row [facility] (Current[facility] from 10[facility]23/2022 in [facility] Office Visit from 02/10/2022 in [facility]  C-SSRS RISK CATEGORY No Risk No Risk No Risk       Last PHQ 2/9 Scores:    02/10/2022   10:18 AM 11/19/2021    3:04 PM 08/26/2020    3:14 PM  Depression screen PHQ 2/9  Decreased Interest 2 0 1  Down, Depressed, Hopeless 2 3 1   PHQ - 2 Score 4 3 2   Altered sleeping 0 0 1  Tired, decreased energy 1 0 1  Change in appetite 0 0 1  Feeling bad or failure about yourself  3 3 1   Trouble concentrating 2 0 1  Moving slowly or fidgety/restless 0 0 1  Suicidal thoughts 0 0 0  PHQ-9 Score 10 6 8   Difficult doing work/chores Somewhat difficult Very difficult Somewhat difficult    Scribe for Treatment Team: Beather Arbour 10/27/2022 11:42 AM

## 2022-10-27 NOTE — Group Note (Signed)
Recreation Therapy Group Note   Group Topic:Health and Wellness  Group Date: 10/27/2022 Start Time: 1400 End Time: 1450 Facilitators: Tashan Kreitzer, Benito Mccreedy, LRT  Activity Description/Intervention: Therapeutic Drumming. Patients with peers and staff were given the opportunity to engage in a leader facilitated [employer] [facility] Empowerment Drumming Circle with staff from the [employer], in partnership with [employer].   Affect/Mood: N/A   Participation Level: Did not attend    Clinical Observations/Individualized Feedback: Pt declined invitation to join RT session offered.   Benito Mccreedy Natori Gudino, LRT, CTRS 10/27/2022 4:32 PM

## 2022-10-27 NOTE — [facility] Group Notes (Signed)
[facility] Group Notes:  (Nursing/MHT/Case Management/Adjunct)  Date:  10/27/2022  Time:  2:42 AM  Type of Therapy:   Wrap-up group  Participation Level:  None  Participation Quality:  Resistant  Affect:  Flat  Cognitive:  Lacking  Insight:  Lacking  Engagement in Group:  Lacking  Modes of Intervention:  Education  Summary of Progress/Problems: Pt attended group although didn't participate.   Noah Delaine 10/27/2022, 2:42 AM

## 2022-10-27 NOTE — Progress Notes (Signed)
   10/27/22 0600  15 Minute Checks  Location Bedroom  Visual Appearance Calm  Behavior Composed  Sleep (Behavioral Health Patients Only)  Calculate sleep? (Click Yes once per 24 hr at 0600 safety check) Yes  Documented sleep last 24 hours 6.5

## 2022-10-27 NOTE — Progress Notes (Signed)
Adult Psychoeducational Group Note  Date:  10/27/2022 Time:  9:40 PM  Group Topic/Focus:  Wrap-Up Group:   The focus of this group is to help patients review their daily goal of treatment and discuss progress on daily workbooks.  Participation Level:  Active  Participation Quality:  Appropriate  Affect:  Appropriate  Cognitive:  Appropriate  Insight: Appropriate  Engagement in Group:  Engaged  Modes of Intervention:  Discussion  Additional Comments:  Pt attended and participated in NA meeting.  Xaivier Malay Katrinka Blazing 10/27/2022, 9:40 PM

## 2022-10-27 NOTE — Progress Notes (Signed)
[facility] MD Progress Note  10/27/2022 6:05 PM Jeffrey Delgado  MRN:  [id number]  Principal Problem: MDD (major depressive disorder) Diagnosis: Principal Problem:   MDD (major depressive disorder)  Reason for admission:  Jeffrey Delgado is a 22 year old male with prior history of psychiatric diagnoses significant for major depressive disorder, ADHD, history of autism spectrum, who presents voluntarily to [facility] from [facility] for worsening depression with recurrent suicidal thought with plan to self-harm and end his life and not wake up in the context of break-up with girlfriend.   24-hour chart Review: Past 24 hours of patient's chart was reviewed.  Patient is compliant with scheduled meds. As needed medications: Received ondansetron 4 mg p.o. 08 43 today for nausea, trazodone 50 mg p.o. at 2136 yesterday for insomnia. Required Agitation PRNs: none Per RN notes, no documented behavioral issues and is attending group. Patient slept, 7 hours CBG ranges from 200-348 milligrams per deciliter.  Hemoglobin A1c 7.0 on 10/23/2022  Today's assessment notes: Patient is seen and examined in his room sitting up on his bed.  He appears alert, calm, cooperative, and oriented to person, time, place, & situation.  Mood appears less depressed, he rates depression as #5/10 with 10 being high severity.  He continues on his scheduled Prozac and Abilify for mood augmentation.  Plan is to titrate Prozac up as as needed per patient assessment.  Denies anxiety and rated as 0/10.  Complain of nausea Zofran 4 mg p.o. every 8 hours as needed initiated.  CBG ranging from 200-348, continues on his scheduled insulin, and awaiting consult from diabetic consultant.  Reports sleeping for 7 hours and being restful with aid of trazodone 50 mg p.o. nightly.  Trazodone replaced with melatonin for better sleep quality.  Reports adequate hydration and having a good appetite for his meals.  No delusional  thinking or paranoia observed during this evaluation.  He denies SI, HI, or AVH.  Continues to contract for safety while in the hospital.  No agitation protocol initiated at this time.  15 minutes safety monitoring continues.  Will continue with current treatment plan with adjustment as indicated below.  Total Time spent with patient: 30 minutes  Past Psychiatric History: Previous Psych Diagnoses: Yes, MDD, anxiety, ADHD, autism spectrum, developmental delay, DMDD, receptive expressive disorder, and SI Prior inpatient treatment: Yes at [facility], at age 26 to 22 years old due to assaulting police officers and destruction of property. Current/prior outpatient treatment: Patient seeing a counselor currently.   Prior rehab hx: Patient denies Psychotherapy hx: Yes History of suicide: Yes x 1 in the past History of homicide or aggression: Has history of genocide thoughts Psychiatric medication history: Yes, fluoxetine and Trileptal Psychiatric medication compliance history: None compliance Neuromodulation history: Denies  Current Psychiatrist: Denies having a psychiatrist Current therapist: Currently seeing a counselor   Past Medical History:  Past Medical History:  Diagnosis Date   ADHD (attention deficit hyperactivity disorder)    Autism spectrum disorder    Developmental delay    Diabetes mellitus without complication (HCC)    Type I   Dysgraphia    History of tympanoplasty of right ear    [facility] 2015   Receptive-expressive language delay     Past Surgical History:  Procedure Laterality Date   ingrown toenails Bilateral    TONSILLECTOMY AND ADENOIDECTOMY     tubes in ear     and another ear surgery to fix the hole where the tubes fell out  Family History:  Family History  Problem Relation Age of Onset   ADD / ADHD Mother    Crohn's disease Mother    Schizophrenia Father    Family Psychiatric  History: Psych: Has history of schizophrenia, mom suffering from paranoia Psych Rx:  Yes SA/HA: Dad Substance use family hx: Grandmother smokes cigarettes, and she is alcoholic  Social History:  Social History   Substance and Sexual Activity  Alcohol Use No     Social History   Substance and Sexual Activity  Drug Use No    Social History   Socioeconomic History   Marital status: Single    Spouse name: Not on file   Number of children: Not on file   Years of education: Not on file   Highest education level: Not on file  Occupational History   Not on file  Tobacco Use   Smoking status: Never   Smokeless tobacco: Never  Vaping Use   Vaping Use: Never used  Substance and Sexual Activity   Alcohol use: No   Drug use: No   Sexual activity: Never  Other Topics Concern   Not on file  Social History Narrative   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  Participates in taekwondo   Social Determinants of Health   Financial Resource Strain: Not on file  Food Insecurity: Patient Declined (10/25/2022)   Hunger Vital Sign    Worried About Running Out of Food in the Last Year: Patient declined    Ran Out of Food in the Last Year: Patient declined  Transportation Needs: Patient Declined (10/25/2022)   PRAPARE - Administrator, Civil Service (Medical): Patient declined    Lack of Transportation (Non-Medical): Patient declined  Physical Activity: Not on file  Stress: Not on file  Social Connections: Not on file   Additional Social History:    Sleep: Good  Appetite:  Good  Current Medications: Current Facility-Administered Medications  Medication Dose Route Frequency Provider Last Rate Last Admin   acetaminophen (TYLENOL) tablet 650 mg  650 mg Oral Q6H PRN Oneta Rack, NP       alum & mag hydroxide-simeth (MAALOX/MYLANTA) 200-200-20 MG/5ML suspension 30 mL  30 mL Oral Q4H PRN Oneta Rack, NP       ARIPiprazole (ABILIFY) tablet 2 mg  2 mg Oral Daily Paton Crum, Jesusita Oka, FNP   2 mg at 10/27/22 1610   diphenhydrAMINE (BENADRYL) capsule 50 mg  50 mg Oral Q8H  PRN Liah Morr, Jesusita Oka, FNP       Or   diphenhydrAMINE (BENADRYL) injection 50 mg  50 mg Intramuscular Q6H PRN Antwoine Zorn, Jesusita Oka, FNP       FLUoxetine (PROZAC) capsule 40 mg  40 mg Oral Daily Shykeria Sakamoto C, FNP   40 mg at 10/27/22 9604   haloperidol (HALDOL) tablet 5 mg  5 mg Oral Q8H PRN Nailani Full, Jesusita Oka, FNP       Or   haloperidol lactate (HALDOL) injection 5 mg  5 mg Intramuscular Q8H PRN Rossi Burdo C, FNP       insulin aspart (novoLOG) injection 0-15 Units  0-15 Units Subcutaneous TID WC Onuoha, Chinwendu V, NP   3 Units at 10/27/22 1658   insulin aspart (novoLOG) injection 0-5 Units  0-5 Units Subcutaneous QHS Onuoha, Chinwendu V, NP   3 Units at 10/26/22 2135   insulin glargine-yfgn (SEMGLEE) injection 20 Units  20 Units Subcutaneous Q breakfast Oneta Rack, NP   20 Units at 10/27/22 [phone]  magnesium hydroxide (MILK OF MAGNESIA) suspension 30 mL  30 mL Oral Daily PRN Oneta Rack, NP       melatonin tablet 3 mg  3 mg Oral QHS Rad Gramling C, FNP   3 mg at 10/26/22 2136   ondansetron (ZOFRAN-ODT) disintegrating tablet 4 mg  4 mg Oral Q8H PRN Cecilie Lowers, FNP   4 mg at 10/27/22 0843   traZODone (DESYREL) tablet 50 mg  50 mg Oral QHS PRN Oneta Rack, NP   50 mg at 10/26/22 2136    Lab Results:  Results for orders placed or performed during the hospital encounter of 10/25/22 (from the past 48 hour(s))  Glucose, capillary     Status: Abnormal   Collection Time: 10/25/22  9:35 PM  Result Value Ref Range   Glucose-Capillary 317 (H) 70 - 99 mg/dL    Comment: Glucose reference range applies only to samples taken after fasting for at least 8 hours.  Glucose, capillary     Status: Abnormal   Collection Time: 10/26/22  5:55 AM  Result Value Ref Range   Glucose-Capillary 257 (H) 70 - 99 mg/dL    Comment: Glucose reference range applies only to samples taken after fasting for at least 8 hours.  Glucose, capillary     Status: Abnormal   Collection Time: 10/26/22 11:36 AM  Result Value Ref Range    Glucose-Capillary 316 (H) 70 - 99 mg/dL    Comment: Glucose reference range applies only to samples taken after fasting for at least 8 hours.  Glucose, capillary     Status: Abnormal   Collection Time: 10/26/22  5:00 PM  Result Value Ref Range   Glucose-Capillary 324 (H) 70 - 99 mg/dL    Comment: Glucose reference range applies only to samples taken after fasting for at least 8 hours.  Glucose, capillary     Status: Abnormal   Collection Time: 10/26/22  7:57 PM  Result Value Ref Range   Glucose-Capillary 282 (H) 70 - 99 mg/dL    Comment: Glucose reference range applies only to samples taken after fasting for at least 8 hours.   Comment 1 Notify RN    Comment 2 Document in Chart   Glucose, capillary     Status: Abnormal   Collection Time: 10/27/22  5:56 AM  Result Value Ref Range   Glucose-Capillary 297 (H) 70 - 99 mg/dL    Comment: Glucose reference range applies only to samples taken after fasting for at least 8 hours.   Comment 1 Notify RN    Comment 2 Document in Chart   Glucose, capillary     Status: Abnormal   Collection Time: 10/27/22 11:45 AM  Result Value Ref Range   Glucose-Capillary 348 (H) 70 - 99 mg/dL    Comment: Glucose reference range applies only to samples taken after fasting for at least 8 hours.   Comment 1 Notify RN   Glucose, capillary     Status: Abnormal   Collection Time: 10/27/22  4:45 PM  Result Value Ref Range   Glucose-Capillary 200 (H) 70 - 99 mg/dL    Comment: Glucose reference range applies only to samples taken after fasting for at least 8 hours.   Comment 1 Notify RN     Blood Alcohol level:  Lab Results  Component Value Date   ETH <10 10/23/2022   ETH <10 10/20/2019    Metabolic Disorder Labs: Lab Results  Component Value Date   HGBA1C 7.0 (H) 10/23/2022  MPG 154.2 10/23/2022   MPG 128 05/25/2021   Lab Results  Component Value Date   PROLACTIN 5.3 10/23/2022   Lab Results  Component Value Date   CHOL 188 10/23/2022   TRIG  66 10/23/2022   HDL 69 10/23/2022   CHOLHDL 2.7 10/23/2022   VLDL 13 10/23/2022   LDLCALC 106 (H) 10/23/2022    Physical Findings: AIMS: Facial and Oral Movements Muscles of Facial Expression: None, normal Lips and Perioral Area: None, normal Jaw: None, normal Tongue: None, normal,Extremity Movements Upper (arms, wrists, hands, fingers): None, normal Lower (legs, knees, ankles, toes): None, normal, Trunk Movements Neck, shoulders, hips: None, normal, Overall Severity Severity of abnormal movements (highest score from questions above): None, normal Incapacitation due to abnormal movements: None, normal Patient's awareness of abnormal movements (rate only patient's report): No Awareness, Dental Status Current problems with teeth and/or dentures?: No Does patient usually wear dentures?: No  CIWA:    COWS:     Musculoskeletal: Strength & Muscle Tone: within normal limits Gait & Station: normal Patient leans: N/A  Psychiatric Specialty Exam:  Presentation  General Appearance:  Bizarre (Multiple body/arms tattoos)  Eye Contact: Fair  Speech: Clear and Coherent  Speech Volume: Normal  Handedness: Right  Mood and Affect  Mood: Dysphoric  Affect: Blunt  Thought Process  Thought Processes: Linear  Descriptions of Associations:Intact  Orientation:Full (Time, Place and Person)  Thought Content:Logical  History of Schizophrenia/Schizoaffective disorder:No  Duration of Psychotic Symptoms:No data recorded Hallucinations:Hallucinations: None  Ideas of Reference:None  Suicidal Thoughts:Suicidal Thoughts: No SI Passive Intent and/or Plan: -- (Denies)  Homicidal Thoughts:Homicidal Thoughts: No  Sensorium  Memory: Immediate Fair; Recent Fair  Judgment: Fair  Insight: Fair  Executive Functions  Concentration: Good  Attention Span: Good  Recall: Fair  Fund of Knowledge: Fair  Language: Good  Psychomotor Activity  Psychomotor  Activity: Psychomotor Activity: Normal  Assets  Assets: Communication Skills; Desire for Improvement; Physical Health; Social Support; Housing  Sleep  Sleep: Sleep: Good Number of Hours of Sleep: 7  Physical Exam: Physical Exam Vitals and nursing note reviewed.  HENT:     Head: Normocephalic.     Nose: Nose normal.     Mouth/Throat:     Mouth: Mucous membranes are moist.     Pharynx: Oropharynx is clear.  Eyes:     Conjunctiva/sclera: Conjunctivae normal.     Pupils: Pupils are equal, round, and reactive to light.  Musculoskeletal:     Cervical back: Normal range of motion.  Neurological:     Mental Status: He is alert.    Review of Systems  Constitutional:  Negative for fever.  HENT:  Negative for sore throat.   Eyes: Negative.   Respiratory:  Negative for shortness of breath.   Cardiovascular:        History of diabetes type 1  Gastrointestinal: Negative.  Negative for nausea and vomiting.       Hx of diabetes x 1  Genitourinary: Negative.   Musculoskeletal: Negative.   Skin: Negative.   Endo/Heme/Allergies: Negative.        See allergy listing  Psychiatric/Behavioral:  Positive for depression. The patient is nervous/anxious and has insomnia.    Blood pressure (!) 103/44, pulse 88, temperature 98 F (36.7 C), temperature source Oral, resp. rate 16, height 5\' 6"  (1.676 m), weight 59.9 kg, SpO2 98 %. Body mass index is 21.31 kg/m.  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 daily for depression, may titrate up as needed Continue hydroxyzine 25 mg p.o. 3 times daily as needed anxiety Increase melatonin 5 mg p.o. nightly for insomnia as needed Continue Abilify 2 mg p.o. daily to augment antidepressant/mood stabilization   Agitation protocol: Benadryl capsule 50 mg p.o. 3 times daily as needed agitation or Benadryl injection 50 mg IM 3 times daily as needed agitation   Haldol tablets 5 mg po 3 times daily as needed agitation or Haldol lactate injection 5 mg IM 3 times daily as needed agitation   Other PRN Medications -Acetaminophen 650 mg every 6 as needed/mild pain -Maalox 30 mL oral every 4 as needed/digestion -Magnesium hydroxide 30 mL daily as needed/mild constipation   Safety and Monitoring: Voluntary admission to inpatient psychiatric unit for safety, stabilization and treatment Daily contact with patient to assess and evaluate symptoms and progress in treatment Patient's case to be discussed in multi-disciplinary team meeting Observation Level : q15 minute checks Vital signs: q12 hours Precautions: suicide, but pt currently verbally contracts for safety on unit    Discharge Planning: Social work and case management to assist with discharge planning and identification of hospital follow-up needs prior to discharge Estimated LOS: 5-7 days Discharge Concerns: Need to establish a safety plan; Medication compliance and effectiveness Discharge Goals: Return home with outpatient referrals for mental health follow-up including medication management/psychotherapy.   Long Term Goal(s): Improvement in symptoms so as ready for discharge   Short Term Goals: Ability to identify changes in lifestyle to reduce recurrence of condition will improve, Ability to verbalize feelings will improve, Ability to disclose and discuss suicidal ideas, Ability to  demonstrate self-control will improve, Ability to identify and develop effective coping behaviors will improve, Ability to maintain clinical measurements within normal limits will improve, Compliance with prescribed medications will improve, and Ability to identify triggers associated with substance abuse/mental health issues will improve   Physician Treatment Plan for Secondary Diagnosis: Principal Problem:   MDD (major depressive disorder)     I certify that inpatient services furnished can reasonably be expected to improve the patient's condition.     Cecilie Lowers, FNP 10/27/2022, 6:05 PM

## 2022-10-27 NOTE — Group Note (Signed)
Date:  10/27/2022 Time:  10:50 AM  Group Topic/Focus:  Goals Group:   The focus of this group is to help patients establish daily goals to achieve during treatment and discuss how the patient can incorporate goal setting into their daily lives to aide in recovery. Orientation:   The focus of this group is to educate the patient on the purpose and policies of crisis stabilization and provide a format to answer questions about their admission.  The group details unit policies and expectations of patients while admitted.    Participation Level:  Did Not Attend  Participation Quality:   n/a  Affect:   n/a  Cognitive:   n/a  Insight: None  Engagement in Group:   n/a  Modes of Intervention:   n/a  Additional Comments:   Pt did not attend.  Edmund Hilda Electra Paladino 10/27/2022, 10:50 AM

## 2022-10-28 DIAGNOSIS — F332 Major depressive disorder, recurrent severe without psychotic features: Secondary | ICD-10-CM | POA: Diagnosis not present

## 2022-10-28 LAB — GLUCOSE, CAPILLARY
Glucose-Capillary: 254 mg/dL — ABNORMAL HIGH (ref 70–99)
Glucose-Capillary: 276 mg/dL — ABNORMAL HIGH (ref 70–99)
Glucose-Capillary: 177 mg/dL — ABNORMAL HIGH (ref 70–99)
Glucose-Capillary: 313 mg/dL — ABNORMAL HIGH (ref 70–99)

## 2022-10-28 MED ORDER — HYDROXYZINE HCL 25 MG PO TABS
25.0000 mg | ORAL_TABLET | Freq: Three times a day (TID) | ORAL | Status: DC | PRN
  Filled 2022-10-28 (×2): qty 1

## 2022-10-28 MED ORDER — INSULIN ASPART 100 UNIT/ML IJ SOLN
3.0000 [IU] | Freq: Three times a day (TID) | INTRAMUSCULAR | Status: DC
  Administered 2022-10-28 – 2022-11-03 (×17): 3 [IU] via SUBCUTANEOUS

## 2022-10-28 MED ORDER — MELATONIN 5 MG PO TABS
5.0000 mg | ORAL_TABLET | Freq: Every day | ORAL | Status: DC
  Administered 2022-10-28 – 2022-11-02 (×6): 5 mg via ORAL
  Filled 2022-10-28 (×8): qty 1

## 2022-10-28 MED ORDER — HYDROXYZINE HCL 25 MG PO TABS
25.0000 mg | ORAL_TABLET | Freq: Every day | ORAL | Status: DC
  Administered 2022-10-28 – 2022-10-30 (×3): 25 mg via ORAL
  Filled 2022-10-28 (×5): qty 1

## 2022-10-28 MED ORDER — INSULIN GLARGINE-YFGN 100 UNIT/ML ~~LOC~~ SOLN
23.0000 [IU] | Freq: Every day | SUBCUTANEOUS | Status: DC
  Administered 2022-10-29 – 2022-11-03 (×6): 23 [IU] via SUBCUTANEOUS

## 2022-10-28 NOTE — Progress Notes (Signed)
[facility] MD Progress Note  10/28/2022 3:40 PM Jeffrey Delgado  MRN:  [id number]  Principal Problem: MDD (major depressive disorder)  Diagnosis: Principal Problem:   MDD (major depressive disorder)  Reason for admission:  Jeffrey Delgado is a 22 year old male with prior history of psychiatric diagnoses significant for major depressive disorder, ADHD, history of autism spectrum, who presents voluntarily to [facility] from [facility] for worsening depression with recurrent suicidal thought with plan to self-harm and end his life and not wake up in the context of break-up with girlfriend.   24-hour chart Review: Past 24 hours of patient's chart was reviewed.  Patient is compliant with scheduled meds. Required Agitation PRNs: none Per RN notes, no documented behavioral issues and is attending group. Patient slept, 7 hours CBG ranges from 200-348 milligrams per deciliter.  Hemoglobin A1c 7.0 on 10/23/2022  Daily notes: Jeffrey Delgado is seen, chart reviewed. The chart findings discussed with treatment team. He presents with a restricted affect, making a fair contact & verbally responsive. Jeffrey Delgado is a man of so little words. He reports, "I had a relationship break-up. The relationship lasted for just 3 weeks. I thought we were going strong, but I sabotaged it myself. And because I have had a bad relationship in the past, I wasn't able to trust my new girlfriend. She got fed-up & broke it off. I miss her. That caused me to become depressed & suicidal. I was gonna cut my wrist, but I did not want to mess-up my tattoo. So, I decided to drive myself to the hospital. I'm just okay today, nothing to complain about". Jeffrey Delgado presents quiet, unassuming. He has several facial piecing, tattoos all over his body. He says he slept ok last night last night, but could use enough sleep. Although still endorsing feeling depressed, he denies any SIHI, AVH, delusional thoughts or paranoia. He does not appear  to be responding to any internal stimuli. There are some changes made on the patient's insulin coverage as recommended by the diabetic coordinator. This has been updated on the treatment on the treatment plan. Patient is encouraged to continue to attend group sessions. Continue as recommended.  Total Time spent with patient:  25 minutes.  Past Psychiatric History: Previous Psych Diagnoses: Yes, MDD, anxiety, ADHD, autism spectrum, developmental delay, DMDD, receptive expressive disorder, and SI Prior inpatient treatment: Yes at [facility], at age 41 to 22 years old due to assaulting police officers and destruction of property. Current/prior outpatient treatment: Patient seeing a counselor currently.   Prior rehab hx: Patient denies Psychotherapy hx: Yes History of suicide: Yes x 1 in the past History of homicide or aggression: Has history of genocide thoughts Psychiatric medication history: Yes, fluoxetine and Trileptal Psychiatric medication compliance history: None compliance Neuromodulation history: Denies  Current Psychiatrist: Denies having a psychiatrist Current therapist: Currently seeing a counselor   Past Medical History:  Past Medical History:  Diagnosis Date   ADHD (attention deficit hyperactivity disorder)    Autism spectrum disorder    Developmental delay    Diabetes mellitus without complication (HCC)    Type I   Dysgraphia    History of tympanoplasty of right ear    [facility] 2015   Receptive-expressive language delay     Past Surgical History:  Procedure Laterality Date   ingrown toenails Bilateral    TONSILLECTOMY AND ADENOIDECTOMY     tubes in ear     and another ear surgery to fix the hole where the tubes fell  out   Family History:  Family History  Problem Relation Age of Onset   ADD / ADHD Mother    Crohn's disease Mother    Schizophrenia Father    Family Psychiatric  History: Psych: Has history of schizophrenia, mom suffering from paranoia Psych Rx:  Yes SA/HA: Dad Substance use family hx: Grandmother smokes cigarettes, and she is alcoholic  Social History:  Social History   Substance and Sexual Activity  Alcohol Use No     Social History   Substance and Sexual Activity  Drug Use No    Social History   Socioeconomic History   Marital status: Single    Spouse name: Not on file   Number of children: Not on file   Years of education: Not on file   Highest education level: Not on file  Occupational History   Not on file  Tobacco Use   Smoking status: Never   Smokeless tobacco: Never  Vaping Use   Vaping Use: Never used  Substance and Sexual Activity   Alcohol use: No   Drug use: No   Sexual activity: Never  Other Topics Concern   Not on file  Social History Narrative   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  Participates in taekwondo   Social Determinants of Health   Financial Resource Strain: Not on file  Food Insecurity: Patient Declined (10/25/2022)   Hunger Vital Sign    Worried About Running Out of Food in the Last Year: Patient declined    Ran Out of Food in the Last Year: Patient declined  Transportation Needs: Patient Declined (10/25/2022)   PRAPARE - Administrator, Civil Service (Medical): Patient declined    Lack of Transportation (Non-Medical): Patient declined  Physical Activity: Not on file  Stress: Not on file  Social Connections: Not on file   Additional Social History:    Sleep: Good  Appetite:  Good  Current Medications: Current Facility-Administered Medications  Medication Dose Route Frequency Provider Last Rate Last Admin   acetaminophen (TYLENOL) tablet 650 mg  650 mg Oral Q6H PRN Oneta Rack, NP       alum & mag hydroxide-simeth (MAALOX/MYLANTA) 200-200-20 MG/5ML suspension 30 mL  30 mL Oral Q4H PRN Oneta Rack, NP       ARIPiprazole (ABILIFY) tablet 2 mg  2 mg Oral Daily Ntuen, Jesusita Oka, FNP   2 mg at 10/28/22 0755   diphenhydrAMINE (BENADRYL) capsule 50 mg  50 mg Oral Q8H  PRN Ntuen, Jesusita Oka, FNP       Or   diphenhydrAMINE (BENADRYL) injection 50 mg  50 mg Intramuscular Q6H PRN Ntuen, Jesusita Oka, FNP       FLUoxetine (PROZAC) capsule 20 mg  20 mg Oral Daily Ntuen, Jesusita Oka, FNP   20 mg at 10/28/22 0755   haloperidol (HALDOL) tablet 5 mg  5 mg Oral Q8H PRN Ntuen, Jesusita Oka, FNP       Or   haloperidol lactate (HALDOL) injection 5 mg  5 mg Intramuscular Q8H PRN Ntuen, Jesusita Oka, FNP       hydrOXYzine (ATARAX) tablet 25 mg  25 mg Oral TID PRN Armandina Stammer I, NP       hydrOXYzine (ATARAX) tablet 25 mg  25 mg Oral QHS Emilyn Ruble I, NP       insulin aspart (novoLOG) injection 0-15 Units  0-15 Units Subcutaneous TID WC Onuoha, Chinwendu V, NP   8 Units at 10/28/22 1212   insulin aspart (novoLOG) injection  0-5 Units  0-5 Units Subcutaneous QHS Onuoha, Chinwendu V, NP   3 Units at 10/27/22 2149   insulin glargine-yfgn (SEMGLEE) injection 20 Units  20 Units Subcutaneous Q breakfast Oneta Rack, NP   20 Units at 10/28/22 0758   magnesium hydroxide (MILK OF MAGNESIA) suspension 30 mL  30 mL Oral Daily PRN Oneta Rack, NP       melatonin tablet 5 mg  5 mg Oral QHS Jane Broughton I, NP       ondansetron (ZOFRAN-ODT) disintegrating tablet 4 mg  4 mg Oral Q8H PRN Ntuen, Jesusita Oka, FNP   4 mg at 10/27/22 1610    Lab Results:  Results for orders placed or performed during the hospital encounter of 10/25/22 (from the past 48 hour(s))  Glucose, capillary     Status: Abnormal   Collection Time: 10/26/22  5:00 PM  Result Value Ref Range   Glucose-Capillary 324 (H) 70 - 99 mg/dL    Comment: Glucose reference range applies only to samples taken after fasting for at least 8 hours.  Glucose, capillary     Status: Abnormal   Collection Time: 10/26/22  7:57 PM  Result Value Ref Range   Glucose-Capillary 282 (H) 70 - 99 mg/dL    Comment: Glucose reference range applies only to samples taken after fasting for at least 8 hours.   Comment 1 Notify RN    Comment 2 Document in Chart   Glucose,  capillary     Status: Abnormal   Collection Time: 10/27/22  5:56 AM  Result Value Ref Range   Glucose-Capillary 297 (H) 70 - 99 mg/dL    Comment: Glucose reference range applies only to samples taken after fasting for at least 8 hours.   Comment 1 Notify RN    Comment 2 Document in Chart   Glucose, capillary     Status: Abnormal   Collection Time: 10/27/22 11:45 AM  Result Value Ref Range   Glucose-Capillary 348 (H) 70 - 99 mg/dL    Comment: Glucose reference range applies only to samples taken after fasting for at least 8 hours.   Comment 1 Notify RN   Glucose, capillary     Status: Abnormal   Collection Time: 10/27/22  4:45 PM  Result Value Ref Range   Glucose-Capillary 200 (H) 70 - 99 mg/dL    Comment: Glucose reference range applies only to samples taken after fasting for at least 8 hours.   Comment 1 Notify RN   Glucose, capillary     Status: Abnormal   Collection Time: 10/27/22  9:16 PM  Result Value Ref Range   Glucose-Capillary 280 (H) 70 - 99 mg/dL    Comment: Glucose reference range applies only to samples taken after fasting for at least 8 hours.   Comment 1 Notify RN    Comment 2 Document in Chart   Glucose, capillary     Status: Abnormal   Collection Time: 10/28/22  5:58 AM  Result Value Ref Range   Glucose-Capillary 254 (H) 70 - 99 mg/dL    Comment: Glucose reference range applies only to samples taken after fasting for at least 8 hours.   Comment 1 Notify RN    Comment 2 Document in Chart   Glucose, capillary     Status: Abnormal   Collection Time: 10/28/22 11:37 AM  Result Value Ref Range   Glucose-Capillary 276 (H) 70 - 99 mg/dL    Comment: Glucose reference range applies only to samples taken  after fasting for at least 8 hours.    Blood Alcohol level:  Lab Results  Component Value Date   ETH <10 10/23/2022   ETH <10 10/20/2019    Metabolic Disorder Labs: Lab Results  Component Value Date   HGBA1C 7.0 (H) 10/23/2022   MPG 154.2 10/23/2022   MPG 128  05/25/2021   Lab Results  Component Value Date   PROLACTIN 5.3 10/23/2022   Lab Results  Component Value Date   CHOL 188 10/23/2022   TRIG 66 10/23/2022   HDL 69 10/23/2022   CHOLHDL 2.7 10/23/2022   VLDL 13 10/23/2022   LDLCALC 106 (H) 10/23/2022    Physical Findings: AIMS: Facial and Oral Movements Muscles of Facial Expression: None, normal Lips and Perioral Area: None, normal Jaw: None, normal Tongue: None, normal,Extremity Movements Upper (arms, wrists, hands, fingers): None, normal Lower (legs, knees, ankles, toes): None, normal, Trunk Movements Neck, shoulders, hips: None, normal, Overall Severity Severity of abnormal movements (highest score from questions above): None, normal Incapacitation due to abnormal movements: None, normal Patient's awareness of abnormal movements (rate only patient's report): No Awareness, Dental Status Current problems with teeth and/or dentures?: No Does patient usually wear dentures?: No  CIWA:    COWS:     Musculoskeletal: Strength & Muscle Tone: within normal limits Gait & Station: normal Patient leans: N/A  Psychiatric Specialty Exam:  Presentation  General Appearance:  Bizarre (Multiple body/arms tattoos)  Eye Contact: Fair  Speech: Clear and Coherent  Speech Volume: Normal  Handedness: Right  Mood and Affect  Mood: Dysphoric  Affect: Blunt  Thought Process  Thought Processes: Linear  Descriptions of Associations:Intact  Orientation:Full (Time, Place and Person)  Thought Content:Logical  History of Schizophrenia/Schizoaffective disorder:No  Duration of Psychotic Symptoms:No data recorded Hallucinations:Hallucinations: None  Ideas of Reference:None  Suicidal Thoughts:Suicidal Thoughts: No SI Passive Intent and/or Plan: -- (Denies)  Homicidal Thoughts:Homicidal Thoughts: No  Sensorium  Memory: Immediate Fair; Recent Fair  Judgment: Fair  Insight: Fair  Executive Functions   Concentration: Good  Attention Span: Good  Recall: Fair  Fund of Knowledge: Fair  Language: Good  Psychomotor Activity  Psychomotor Activity: Psychomotor Activity: Normal  Assets  Assets: Communication Skills; Desire for Improvement; Physical Health; Social Support; Housing  Sleep  Sleep: Sleep: Good Number of Hours of Sleep: 7  Physical Exam: Physical Exam Vitals and nursing note reviewed.  HENT:     Head: Normocephalic.     Nose: Nose normal.     Mouth/Throat:     Mouth: Mucous membranes are moist.     Pharynx: Oropharynx is clear.  Eyes:     Conjunctiva/sclera: Conjunctivae normal.     Pupils: Pupils are equal, round, and reactive to light.  Cardiovascular:     Rate and Rhythm: Normal rate.     Pulses: Normal pulses.  Pulmonary:     Effort: Pulmonary effort is normal.  Genitourinary:    Comments: Deferred Musculoskeletal:        General: Normal range of motion.     Cervical back: Normal range of motion.  Skin:    General: Skin is warm and dry.  Neurological:     General: No focal deficit present.     Mental Status: He is alert and oriented to person, place, and time.    Review of Systems  Constitutional:  Negative for fever.  HENT:  Negative for congestion and sore throat.   Eyes: Negative.   Respiratory:  Negative for cough, shortness of breath and  wheezing.   Cardiovascular:  Negative for chest pain and palpitations.       History of diabetes type 1  Gastrointestinal: Negative.  Negative for abdominal pain, constipation, diarrhea, nausea and vomiting.       Hx of diabetes x 1  Genitourinary: Negative.   Musculoskeletal: Negative.   Skin: Negative.   Neurological:  Negative for dizziness, tingling, tremors, sensory change, speech change, focal weakness, seizures, loss of consciousness, weakness and headaches.  Endo/Heme/Allergies: Negative.        See allergy listing  Psychiatric/Behavioral:  Positive for depression. The patient is  nervous/anxious and has insomnia.    Blood pressure 126/61, pulse 98, temperature 97.8 F (36.6 C), temperature source Oral, resp. rate 16, height 5\' 6"  (1.676 m), weight 59.9 kg, SpO2 99 %. Body mass index is 21.31 kg/m.                                                                                           Treatment Plan Summary: Daily contact with patient to assess and evaluate symptoms and progress in treatment and Medication management  Continue inpatient hospitalization.  Will continue today 10/28/2022 plan as below except where it is noted.    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 Continue hydroxyzine 25 mg p.o. 3 times daily as needed anxiety Continue melatonin 5 mg po Q has for insomnia . Continue Abilify 2 mg po qd for augment antidepressant/augmentation.  Initiated Hydroxyzine 25 mg po Q hs for insomnia.   Agitation protocol: Benadryl capsule 50 mg p.o. 3 times daily as needed agitation or Benadryl injection 50 mg IM 3 times daily as needed agitation.   Medical issues.  Continue SEMGLEE 23 units SubQ daily with breakfast for DM.  Continue CBG/sliding insulin coverage as recommended for DM.  Continue Novolog insulin 3 units tid with meals for DM    Haldol tablets 5 mg po 3 times daily as needed agitation or Haldol lactate injection 5 mg IM 3 times daily as needed agitation   Other PRN Medications -Acetaminophen 650 mg every 6 as needed/mild pain -Maalox 30 mL oral every 4 as needed/digestion -Magnesium hydroxide 30 mL daily as needed/mild constipation   Safety and Monitoring: Voluntary admission to inpatient psychiatric unit for safety, stabilization and treatment Daily contact with patient to assess and evaluate symptoms and progress in  treatment Patient's case to be discussed in multi-disciplinary team meeting Observation Level : q15 minute checks Vital signs: q12 hours Precautions: suicide, but pt currently verbally contracts for safety on unit    Discharge Planning: Social work and case management to assist with discharge planning and identification of hospital follow-up needs prior to discharge Estimated LOS: 5-7 days Discharge Concerns: Need to establish a safety plan; Medication compliance and effectiveness Discharge Goals: Return home with outpatient referrals for mental health follow-up including medication management/psychotherapy.    I certify that inpatient services furnished can reasonably be expected to improve the patient's condition.  Armandina Stammer, NP, pmhnp, fnp-bc. 10/28/2022, 3:40 PMPatient ID: Jeffrey Delgado, male   DOB: 2001-03-26, 21 y.o.   MRN: [id number]

## 2022-10-28 NOTE — Progress Notes (Signed)
   10/27/22 2200  Psych Admission Type (Psych Patients Only)  Admission Status Voluntary  Psychosocial Assessment  Patient Complaints Depression  Eye Contact Brief  Facial Expression Flat  Affect Flat  Speech Logical/coherent  Interaction Minimal  Motor Activity Slow  Appearance/Hygiene Unremarkable  Behavior Characteristics Cooperative;Appropriate to situation  Mood Depressed;Anxious  Thought Process  Coherency WDL  Content WDL  Delusions None reported or observed  Perception WDL  Hallucination None reported or observed  Judgment Limited  Confusion None  Danger to Self  Current suicidal ideation? Denies  Agreement Not to Harm Self Yes  Description of Agreement Verbal contract  Danger to Others  Danger to Others None reported or observed

## 2022-10-28 NOTE — Progress Notes (Signed)
D: Patient is alert, oriented, and cooperative. Denies SI, HI, AVH, and verbally contracts for safety. Patient reports he slept poorly last night. Patient reports his appetite as fair, energy level as normal, and concentration as good. Patient rates his depression 8/10, hopelessness 9/10, and anxiety 0/10. Patient denies physical symptoms/pain.    A: Scheduled medications administered per MD order. Support provided. Patient educated on safety on the unit and medications. Routine safety checks every 15 minutes. Patient stated understanding to tell nurse about any new physical symptoms. Patient understands to tell staff of any needs.     R: No adverse drug reactions noted. Patient verbally contracts for safety. Patient remains safe at this time and will continue to monitor.    10/28/22 0900  Psych Admission Type (Psych Patients Only)  Admission Status Voluntary  Psychosocial Assessment  Patient Complaints Depression;Hopelessness  Eye Contact Brief  Facial Expression Flat  Affect Flat  Speech Logical/coherent  Interaction Minimal  Motor Activity Slow  Appearance/Hygiene Unremarkable  Behavior Characteristics Cooperative  Mood Depressed;Sad  Thought Process  Coherency WDL  Content WDL  Delusions None reported or observed  Perception WDL  Hallucination None reported or observed  Judgment Limited  Confusion None  Danger to Self  Current suicidal ideation? Denies  Agreement Not to Harm Self Yes  Description of Agreement verbal  Danger to Others  Danger to Others None reported or observed

## 2022-10-28 NOTE — Group Note (Signed)
Date:  10/28/2022 Time:  12:36 PM  Group Topic/Focus:  Emotional Education:   The focus of this group is to discuss what feelings/emotions are, and how they are experienced. Self Care:   The focus of this group is to help patients understand the importance of self-care in order to improve or restore emotional, physical, spiritual, interpersonal, and financial health.    Participation Level:  Did Not Attend  Participation Quality:    Affect:    Cognitive:    Insight:   Engagement in Group:    Modes of Intervention:    Additional Comments:    Memory Dance Rilynne Lonsway 10/28/2022, 12:36 PM

## 2022-10-28 NOTE — Inpatient Diabetes Management (Signed)
Inpatient Diabetes Program Recommendations  AACE/ADA: New Consensus Statement on Inpatient Glycemic Control (2015)  Target Ranges:  Prepandial:   less than 140 mg/dL      Peak postprandial:   less than 180 mg/dL (1-2 hours)      Critically ill patients:  140 - 180 mg/dL   Lab Results  Component Value Date   GLUCAP 254 (H) 10/28/2022   HGBA1C 7.0 (H) 10/23/2022    Review of Glycemic Control  Latest Reference Range & Units 10/27/22 05:56 10/27/22 11:45 10/27/22 16:45 10/27/22 21:16 10/28/22 05:58  Glucose-Capillary 70 - 99 mg/dL 213 (H) 086 (H) 578 (H) 280 (H) 254 (H)  (H): Data is abnormally high  Diabetes history: DM1(does not make insulin.  Needs correction, basal and meal coverage) Outpatient Diabetes medications: Lantus 20 units QD, Novolog correction and carb coverage, Dexcom G7 Current orders for Inpatient glycemic control: Semglee 20 units QAM, Novolog 0-15 units TID and 0-5 units QHS  Inpatient Diabetes Program Recommendations:    Semglee 23 units QD Novolog 3 units TID with meals if she consumes at least 50%  Will continue to follow while inpatient.  Thank you, Dulce Sellar, MSN, CDCES Diabetes Coordinator Inpatient Diabetes Program [phone] (team pager from 8a-5p)

## 2022-10-28 NOTE — Plan of Care (Signed)
  Problem: Coping: Goal: Ability to identify and develop effective coping behavior will improve Outcome: Progressing   Problem: Health Behavior/Discharge Planning: Goal: Ability to identify and utilize available resources and services will improve Outcome: Progressing   Problem: Metabolic: Goal: Ability to maintain appropriate glucose levels will improve Outcome: Progressing

## 2022-10-28 NOTE — Progress Notes (Signed)
   10/28/22 0630  15 Minute Checks  Location Dayroom  Visual Appearance Calm  Behavior Composed  Sleep (Behavioral Health Patients Only)  Calculate sleep? (Click Yes once per 24 hr at 0600 safety check) Yes  Documented sleep last 24 hours 6.25

## 2022-10-28 NOTE — Progress Notes (Signed)
Adult Psychoeducational Group Note  Date:  10/28/2022 Time:  9:18 PM  Group Topic/Focus:  Wrap-Up Group:   The focus of this group is to help patients review their daily goal of treatment and discuss progress on daily workbooks.  Participation Level:  Active  Participation Quality:  Appropriate  Affect:  Appropriate  Cognitive:  Appropriate  Insight: Appropriate  Engagement in Group:  Engaged  Modes of Intervention:  Discussion  Additional Comments:  Pt did not have a goal.  Jeffrey Delgado 10/28/2022, 9:18 PM

## 2022-10-28 NOTE — Group Note (Signed)
Occupational Therapy Group Note  Group Topic: Sleep Hygiene  Group Date: 10/28/2022 Start Time: 1435 End Time: 1505 Facilitators: Ted Mcalpine, OT   Group Description: Group encouraged increased participation and engagement through topic focused on sleep hygiene. Patients reflected on the quality of sleep they typically receive and identified areas that need improvement. Group was given background information on sleep and sleep hygiene, including common sleep disorders. Group members also received information on how to improve one's sleep and introduced a sleep diary as a tool that can be utilized to track sleep quality over a length of time. Group session ended with patients identifying one or more strategies they could utilize or implement into their sleep routine in order to improve overall sleep quality.        Therapeutic Goal(s):  Identify one or more strategies to improve overall sleep hygiene  Identify one or more areas of sleep that are negatively impacted (sleep too much, too little, etc)     Participation Level: Engaged   Participation Quality: Independent   Behavior: Appropriate   Speech/Thought Process: Relevant   Affect/Mood: Appropriate   Insight: Fair   Judgement: Fair      Modes of Intervention: Education  Patient Response to Interventions:  Attentive   Plan: Continue to engage patient in OT groups 2 - 3x/week.  10/28/2022  Ted Mcalpine, OT   Jeffrey Delgado, OT

## 2022-10-29 LAB — GLUCOSE, CAPILLARY
Glucose-Capillary: 217 mg/dL — ABNORMAL HIGH (ref 70–99)
Glucose-Capillary: 354 mg/dL — ABNORMAL HIGH (ref 70–99)
Glucose-Capillary: 246 mg/dL — ABNORMAL HIGH (ref 70–99)
Glucose-Capillary: 214 mg/dL — ABNORMAL HIGH (ref 70–99)
Glucose-Capillary: 201 mg/dL — ABNORMAL HIGH (ref 70–99)

## 2022-10-29 NOTE — Progress Notes (Signed)
   10/28/22 2100  Psych Admission Type (Psych Patients Only)  Admission Status Voluntary  Psychosocial Assessment  Patient Complaints Depression;Hopelessness  Eye Contact Brief  Facial Expression Flat  Affect Flat  Speech Logical/coherent  Interaction Minimal  Motor Activity Slow  Appearance/Hygiene Unremarkable  Behavior Characteristics Cooperative  Mood Depressed  Thought Process  Coherency WDL  Content WDL  Delusions None reported or observed  Perception WDL  Hallucination None reported or observed  Judgment Limited  Confusion None  Danger to Self  Current suicidal ideation? Denies  Agreement Not to Harm Self Yes  Description of Agreement Verbal  Danger to Others  Danger to Others None reported or observed

## 2022-10-29 NOTE — Inpatient Diabetes Management (Signed)
Inpatient Diabetes Program Recommendations  AACE/ADA: New Consensus Statement on Inpatient Glycemic Control (2015)  Target Ranges:  Prepandial:   less than 140 mg/dL      Peak postprandial:   less than 180 mg/dL (1-2 hours)      Critically ill patients:  140 - 180 mg/dL   Lab Results  Component Value Date   GLUCAP 354 (H) 10/29/2022   HGBA1C 7.0 (H) 10/23/2022    Review of Glycemic Control  Latest Reference Range & Units 10/28/22 05:58 10/28/22 11:37 10/28/22 16:44 10/28/22 20:59 10/29/22 05:53 10/29/22 08:07  Glucose-Capillary 70 - 99 mg/dL 045 (H) 409 (H) 811 (H) 313 (H) 217 (H) 354 (H)   Diabetes history: DM1(does not make insulin.  Needs correction, basal and meal coverage) Outpatient Diabetes medications: Lantus 20 units QD, Novolog correction and carb coverage, Dexcom G7 Current orders for Inpatient glycemic control: Semglee 23 units QAM, Novolog 0-15 units TID and 0-5 units QHS, Novolog 3 units tid meal coverage  Inpatient Diabetes Program Recommendations:    Increase Novolog meal coverage to 6 units tid if eating >50% of meals  Will continue to follow while inpatient.  Thanks, Christena Deem RN, MSN, BC-ADM Inpatient Diabetes Coordinator Team Pager [phone] (8a-5p)

## 2022-10-29 NOTE — Group Note (Signed)
Date:  10/29/2022 Time:  10:33 PM  Group Topic/Focus:  AA    Participation Level:  Active  Participation Quality:  Appropriate  Affect:  Appropriate  Cognitive:  Appropriate  Insight: Appropriate  Engagement in Group:  Engaged  Modes of Intervention:  Education  Additional Comments:  Patient attended AA group  Scot Dock 10/29/2022, 10:33 PM

## 2022-10-29 NOTE — Progress Notes (Signed)
   10/29/22 0610  15 Minute Checks  Location Bedroom  Visual Appearance Calm  Behavior Composed  Sleep (Behavioral Health Patients Only)  Calculate sleep? (Click Yes once per 24 hr at 0600 safety check) Yes  Documented sleep last 24 hours 7    

## 2022-10-29 NOTE — Progress Notes (Signed)
[facility] MD Progress Note  10/29/2022 3:56 PM Jeffrey Delgado  MRN:  [id number]  Principal Problem: MDD (major depressive disorder), recurrent episode, severe (HCC)  Diagnosis: Principal Problem:   MDD (major depressive disorder), recurrent episode, severe (HCC) Active Problems:   MDD (major depressive disorder)  Reason for admission:  Jeffrey Delgado is a 22 year old male with prior history of psychiatric diagnoses significant for major depressive disorder, ADHD, history of autism spectrum, who presents voluntarily to [facility] from [facility] for worsening depression with recurrent suicidal thought with plan to self-harm and end his life and not wake up in the context of break-up with girlfriend.   24-hour chart Review: Past 24 hours of patient's chart was reviewed.  Patient is compliant with scheduled meds. Required Agitation PRNs: none Per RN notes, no documented behavioral issues and is attending group. Patient slept, 7 hours CBG ranges from 200-348 milligrams per deciliter.  Hemoglobin A1c 7.0 on 10/23/2022  Daily notes: Jeffrey Delgado is seen in his room, chart reviewed. The chart findings discussed with the treatment team. He presents with a much brighter affect today. He is more talkative than  yesterday. He reports that he is doing well. He thinks his medications are helpful. However, he is worried on how when he gets discharged, gets home & realizes that the short relationship he had with a girl he likes has ended. He says this girl wants two of them to still remain friends after all these. He says he is also concerned that she may have left him some text messages on his phone that may bother him after he reads them. Other than this, [name] seems to be doing well on the unit. He is compliant in taking his medications. He is attending group sessions, learning coping skills. These are no behavioral problems noted or reported by staff. Patient is eating well. Says he slept  well last night. There are no diabetic distress reported by staff. He currently denies any SIHI, AVH, delusional thoughts or paranoia. He does not appear to be responding to any internal stimuli. There are no changes made on his current plan of care. Will continue as already in progress. Vital signs remain stable. The social worker indicated that patient has an outpatient counseling sessions scheduled. This counseling clinic are working on getting an NP for medication management. Ps, see the SW notes.  Total Time spent with patient:  25 minutes.  Past Psychiatric History: Previous Psych Diagnoses: Yes, MDD, anxiety, ADHD, autism spectrum, developmental delay, DMDD, receptive expressive disorder, and SI Prior inpatient treatment: Yes at [facility], at age 40 to 22 years old due to assaulting police officers and destruction of property. Current/prior outpatient treatment: Patient seeing a counselor currently.   Prior rehab hx: Patient denies Psychotherapy hx: Yes History of suicide: Yes x 1 in the past History of homicide or aggression: Has history of genocide thoughts Psychiatric medication history: Yes, fluoxetine and Trileptal Psychiatric medication compliance history: None compliance Neuromodulation history: Denies  Current Psychiatrist: Denies having a psychiatrist Current therapist: Currently seeing a counselor   Past Medical History:  Past Medical History:  Diagnosis Date   ADHD (attention deficit hyperactivity disorder)    Autism spectrum disorder    Developmental delay    Diabetes mellitus without complication (HCC)    Type I   Dysgraphia    History of tympanoplasty of right ear    [facility] 2015   Receptive-expressive language delay     Past Surgical History:  Procedure Laterality  Date   ingrown toenails Bilateral    TONSILLECTOMY AND ADENOIDECTOMY     tubes in ear     and another ear surgery to fix the hole where the tubes fell out   Family History:  Family History  Problem  Relation Age of Onset   ADD / ADHD Mother    Crohn's disease Mother    Schizophrenia Father    Family Psychiatric  History: Psych: Has history of schizophrenia, mom suffering from paranoia Psych Rx: Yes SA/HA: Dad Substance use family hx: Grandmother smokes cigarettes, and she is alcoholic  Social History:  Social History   Substance and Sexual Activity  Alcohol Use No     Social History   Substance and Sexual Activity  Drug Use No    Social History   Socioeconomic History   Marital status: Single    Spouse name: Not on file   Number of children: Not on file   Years of education: Not on file   Highest education level: Not on file  Occupational History   Not on file  Tobacco Use   Smoking status: Never   Smokeless tobacco: Never  Vaping Use   Vaping Use: Never used  Substance and Sexual Activity   Alcohol use: No   Drug use: No   Sexual activity: Never  Other Topics Concern   Not on file  Social History Narrative   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  Participates in taekwondo   Social Determinants of Health   Financial Resource Strain: Not on file  Food Insecurity: Patient Declined (10/25/2022)   Hunger Vital Sign    Worried About Running Out of Food in the Last Year: Patient declined    Ran Out of Food in the Last Year: Patient declined  Transportation Needs: Patient Declined (10/25/2022)   PRAPARE - Administrator, Civil Service (Medical): Patient declined    Lack of Transportation (Non-Medical): Patient declined  Physical Activity: Not on file  Stress: Not on file  Social Connections: Not on file   Additional Social History:    Sleep: Good  Appetite:  Good  Current Medications: Current Facility-Administered Medications  Medication Dose Route Frequency Provider Last Rate Last Admin   acetaminophen (TYLENOL) tablet 650 mg  650 mg Oral Q6H PRN Oneta Rack, NP       alum & mag hydroxide-simeth (MAALOX/MYLANTA) 200-200-20 MG/5ML suspension 30 mL   30 mL Oral Q4H PRN Oneta Rack, NP       ARIPiprazole (ABILIFY) tablet 2 mg  2 mg Oral Daily Ntuen, Jesusita Oka, FNP   2 mg at 10/29/22 0853   diphenhydrAMINE (BENADRYL) capsule 50 mg  50 mg Oral Q8H PRN Ntuen, Jesusita Oka, FNP       Or   diphenhydrAMINE (BENADRYL) injection 50 mg  50 mg Intramuscular Q6H PRN Ntuen, Jesusita Oka, FNP       FLUoxetine (PROZAC) capsule 20 mg  20 mg Oral Daily Ntuen, Tina C, FNP   20 mg at 10/29/22 0853   haloperidol (HALDOL) tablet 5 mg  5 mg Oral Q8H PRN Ntuen, Jesusita Oka, FNP       Or   haloperidol lactate (HALDOL) injection 5 mg  5 mg Intramuscular Q8H PRN Ntuen, Jesusita Oka, FNP       hydrOXYzine (ATARAX) tablet 25 mg  25 mg Oral TID PRN Armandina Stammer I, NP       hydrOXYzine (ATARAX) tablet 25 mg  25 mg Oral QHS Sanjuana Kava, NP  25 mg at 10/28/22 2112   insulin aspart (novoLOG) injection 0-15 Units  0-15 Units Subcutaneous TID WC Onuoha, Chinwendu V, NP   5 Units at 10/29/22 1216   insulin aspart (novoLOG) injection 0-5 Units  0-5 Units Subcutaneous QHS Onuoha, Chinwendu V, NP   4 Units at 10/28/22 2115   insulin aspart (novoLOG) injection 3 Units  3 Units Subcutaneous TID WC Armandina Stammer I, NP   3 Units at 10/29/22 1212   insulin glargine-yfgn (SEMGLEE) injection 23 Units  23 Units Subcutaneous Q breakfast Armandina Stammer I, NP   23 Units at 10/29/22 0850   magnesium hydroxide (MILK OF MAGNESIA) suspension 30 mL  30 mL Oral Daily PRN Oneta Rack, NP       melatonin tablet 5 mg  5 mg Oral QHS Azana Kiesler I, NP   5 mg at 10/28/22 2113   ondansetron (ZOFRAN-ODT) disintegrating tablet 4 mg  4 mg Oral Q8H PRN Cecilie Lowers, FNP   4 mg at 10/27/22 1610    Lab Results:  Results for orders placed or performed during the hospital encounter of 10/25/22 (from the past 48 hour(s))  Glucose, capillary     Status: Abnormal   Collection Time: 10/27/22  4:45 PM  Result Value Ref Range   Glucose-Capillary 200 (H) 70 - 99 mg/dL    Comment: Glucose reference range applies only to samples  taken after fasting for at least 8 hours.   Comment 1 Notify RN   Glucose, capillary     Status: Abnormal   Collection Time: 10/27/22  9:16 PM  Result Value Ref Range   Glucose-Capillary 280 (H) 70 - 99 mg/dL    Comment: Glucose reference range applies only to samples taken after fasting for at least 8 hours.   Comment 1 Notify RN    Comment 2 Document in Chart   Glucose, capillary     Status: Abnormal   Collection Time: 10/28/22  5:58 AM  Result Value Ref Range   Glucose-Capillary 254 (H) 70 - 99 mg/dL    Comment: Glucose reference range applies only to samples taken after fasting for at least 8 hours.   Comment 1 Notify RN    Comment 2 Document in Chart   Glucose, capillary     Status: Abnormal   Collection Time: 10/28/22 11:37 AM  Result Value Ref Range   Glucose-Capillary 276 (H) 70 - 99 mg/dL    Comment: Glucose reference range applies only to samples taken after fasting for at least 8 hours.  Glucose, capillary     Status: Abnormal   Collection Time: 10/28/22  4:44 PM  Result Value Ref Range   Glucose-Capillary 177 (H) 70 - 99 mg/dL    Comment: Glucose reference range applies only to samples taken after fasting for at least 8 hours.  Glucose, capillary     Status: Abnormal   Collection Time: 10/28/22  8:59 PM  Result Value Ref Range   Glucose-Capillary 313 (H) 70 - 99 mg/dL    Comment: Glucose reference range applies only to samples taken after fasting for at least 8 hours.   Comment 1 Notify RN    Comment 2 Document in Chart   Glucose, capillary     Status: Abnormal   Collection Time: 10/29/22  5:53 AM  Result Value Ref Range   Glucose-Capillary 217 (H) 70 - 99 mg/dL    Comment: Glucose reference range applies only to samples taken after fasting for at least 8 hours.  Comment 1 Notify RN    Comment 2 Document in Chart   Glucose, capillary     Status: Abnormal   Collection Time: 10/29/22  8:07 AM  Result Value Ref Range   Glucose-Capillary 354 (H) 70 - 99 mg/dL     Comment: Glucose reference range applies only to samples taken after fasting for at least 8 hours.   Comment 1 Document in Chart   Glucose, capillary     Status: Abnormal   Collection Time: 10/29/22 11:44 AM  Result Value Ref Range   Glucose-Capillary 246 (H) 70 - 99 mg/dL    Comment: Glucose reference range applies only to samples taken after fasting for at least 8 hours.   Comment 1 Notify RN     Blood Alcohol level:  Lab Results  Component Value Date   ETH <10 10/23/2022   ETH <10 10/20/2019    Metabolic Disorder Labs: Lab Results  Component Value Date   HGBA1C 7.0 (H) 10/23/2022   MPG 154.2 10/23/2022   MPG 128 05/25/2021   Lab Results  Component Value Date   PROLACTIN 5.3 10/23/2022   Lab Results  Component Value Date   CHOL 188 10/23/2022   TRIG 66 10/23/2022   HDL 69 10/23/2022   CHOLHDL 2.7 10/23/2022   VLDL 13 10/23/2022   LDLCALC 106 (H) 10/23/2022    Physical Findings: AIMS: Facial and Oral Movements Muscles of Facial Expression: None, normal Lips and Perioral Area: None, normal Jaw: None, normal Tongue: None, normal,Extremity Movements Upper (arms, wrists, hands, fingers): None, normal Lower (legs, knees, ankles, toes): None, normal, Trunk Movements Neck, shoulders, hips: None, normal, Overall Severity Severity of abnormal movements (highest score from questions above): None, normal Incapacitation due to abnormal movements: None, normal Patient's awareness of abnormal movements (rate only patient's report): No Awareness, Dental Status Current problems with teeth and/or dentures?: No Does patient usually wear dentures?: No  CIWA:    COWS:     Musculoskeletal: Strength & Muscle Tone: within normal limits Gait & Station: normal Patient leans: N/A  Psychiatric Specialty Exam:  Presentation  General Appearance:  Appropriate for Environment; Casual; Disheveled  Eye Contact: Fair  Speech: Clear and Coherent; Normal Rate  Speech  Volume: Normal  Handedness: Right  Mood and Affect  Mood: -- (Improving)  Affect: Congruent  Thought Process  Thought Processes: Coherent; Goal Directed; Linear  Descriptions of Associations:Intact  Orientation:Full (Time, Place and Person)  Thought Content:Logical  History of Schizophrenia/Schizoaffective disorder:No  Duration of Psychotic Symptoms:No data recorded Hallucinations:Hallucinations: None   Ideas of Reference:None  Suicidal Thoughts:Suicidal Thoughts: No SI Passive Intent and/or Plan: Without Intent; Without Means to Carry Out; Without Plan; Without Access to Means   Homicidal Thoughts:Homicidal Thoughts: No   Sensorium  Memory: Immediate Good; Recent Good; Remote Good  Judgment: Fair  Insight: Fair  Art therapist  Concentration: Good  Attention Span: Good  Recall: Good  Fund of Knowledge: Fair  Language: Good  Psychomotor Activity  Psychomotor Activity: Psychomotor Activity: Normal   Assets  Assets: Communication Skills; Desire for Improvement; Financial Resources/Insurance; Housing; Social Support; Resilience; Physical Health  Sleep  Sleep: Sleep: Good Number of Hours of Sleep: 7.5   Physical Exam: Physical Exam Vitals and nursing note reviewed.  HENT:     Head: Normocephalic.     Nose: Nose normal.     Mouth/Throat:     Mouth: Mucous membranes are moist.     Pharynx: Oropharynx is clear.  Eyes:     Conjunctiva/sclera:  Conjunctivae normal.     Pupils: Pupils are equal, round, and reactive to light.  Cardiovascular:     Rate and Rhythm: Normal rate.     Pulses: Normal pulses.  Pulmonary:     Effort: Pulmonary effort is normal.  Genitourinary:    Comments: Deferred Musculoskeletal:        General: Normal range of motion.     Cervical back: Normal range of motion.  Skin:    General: Skin is warm and dry.  Neurological:     General: No focal deficit present.     Mental Status: He is alert and  oriented to person, place, and time.    Review of Systems  Constitutional:  Negative for fever.  HENT:  Negative for congestion and sore throat.   Eyes: Negative.   Respiratory:  Negative for cough, shortness of breath and wheezing.   Cardiovascular:  Negative for chest pain and palpitations.       History of diabetes type 1  Gastrointestinal: Negative.  Negative for abdominal pain, constipation, diarrhea, nausea and vomiting.       Hx of diabetes x 1  Genitourinary: Negative.   Musculoskeletal: Negative.   Skin: Negative.   Neurological:  Negative for dizziness, tingling, tremors, sensory change, speech change, focal weakness, seizures, loss of consciousness, weakness and headaches.  Endo/Heme/Allergies: Negative.        See allergy listing  Psychiatric/Behavioral:  Positive for depression. The patient is nervous/anxious and has insomnia.    Blood pressure 128/65, pulse 91, temperature 97.9 F (36.6 C), temperature source Oral, resp. rate 16, height 5\' 6"  (1.676 m), weight 59.9 kg, SpO2 99 %. Body mass index is 21.31 kg/m.                                                                                           Treatment Plan Summary: Daily contact with patient to assess and evaluate symptoms and progress in treatment and Medication management  Continue inpatient hospitalization.  Will continue today 10/29/2022 plan as below except where it is noted.    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 Continue hydroxyzine 25 mg p.o. 3 times daily as needed anxiety Continue melatonin 5 mg po Q has for insomnia . Continue Abilify 2 mg po qd for augment antidepressant/augmentation.  Continue Hydroxyzine 25 mg po Q hs for insomnia.   Agitation protocol: Benadryl  capsule 50 mg p.o. 3 times daily as needed agitation or Benadryl injection 50 mg IM 3 times daily as needed agitation.   Medical issues.  Continue SEMGLEE 23 units SubQ daily with breakfast for DM.  Continue CBG/sliding insulin coverage as recommended for DM.  Continue Novolog insulin 3 units tid with meals for DM    Haldol tablets 5 mg po 3 times daily as needed agitation or Haldol lactate injection 5 mg IM 3 times daily as needed agitation   Other PRN Medications -Acetaminophen 650 mg every 6 as needed/mild pain -Maalox  30 mL oral every 4 as needed/digestion -Magnesium hydroxide 30 mL daily as needed/mild constipation   Safety and Monitoring: Voluntary admission to inpatient psychiatric unit for safety, stabilization and treatment Daily contact with patient to assess and evaluate symptoms and progress in treatment Patient's case to be discussed in multi-disciplinary team meeting Observation Level : q15 minute checks Vital signs: q12 hours Precautions: suicide, but pt currently verbally contracts for safety on unit    Discharge Planning: Social work and case management to assist with discharge planning and identification of hospital follow-up needs prior to discharge Estimated LOS: 5-7 days Discharge Concerns: Need to establish a safety plan; Medication compliance and effectiveness Discharge Goals: Return home with outpatient referrals for mental health follow-up including medication management/psychotherapy.    I certify that inpatient services furnished can reasonably be expected to improve the patient's condition.     Armandina Stammer, NP, pmhnp, fnp-bc. 10/29/2022, 3:56 PMPatient ID: [name], male   DOB: 09-12-2000, 21 y.o.   MRN: [id number] Patient ID: ODYN SIAM, male   DOB: [birth year], 21 y.o.   MRN: [id number]

## 2022-10-29 NOTE — Progress Notes (Signed)
   10/29/22 2100  Psych Admission Type (Psych Patients Only)  Admission Status Voluntary  Psychosocial Assessment  Patient Complaints Depression  Eye Contact Fair  Facial Expression Flat  Affect Flat  Speech Logical/coherent  Interaction Minimal  Motor Activity Slow  Appearance/Hygiene In hospital gown  Behavior Characteristics Cooperative  Mood Depressed  Thought Process  Coherency WDL  Content WDL  Delusions None reported or observed  Perception WDL  Hallucination None reported or observed  Judgment Limited  Confusion None  Danger to Self  Current suicidal ideation? Denies  Agreement Not to Harm Self Yes  Description of Agreement Verbal contract    

## 2022-10-30 LAB — GLUCOSE, CAPILLARY
Glucose-Capillary: 170 mg/dL — ABNORMAL HIGH (ref 70–99)
Glucose-Capillary: 242 mg/dL — ABNORMAL HIGH (ref 70–99)
Glucose-Capillary: 199 mg/dL — ABNORMAL HIGH (ref 70–99)
Glucose-Capillary: 294 mg/dL — ABNORMAL HIGH (ref 70–99)

## 2022-10-30 MED ORDER — FLUOXETINE HCL 20 MG PO CAPS
40.0000 mg | ORAL_CAPSULE | Freq: Every day | ORAL | Status: DC
  Administered 2022-10-31 – 2022-11-03 (×4): 40 mg via ORAL
  Filled 2022-10-30 (×7): qty 2

## 2022-10-30 NOTE — Progress Notes (Signed)
   10/30/22 0800  Psych Admission Type (Psych Patients Only)  Admission Status Voluntary  Psychosocial Assessment  Patient Complaints Depression  Eye Contact Fair  Facial Expression Flat  Affect Flat  Speech Logical/coherent  Interaction Minimal  Motor Activity Slow  Appearance/Hygiene Unremarkable  Behavior Characteristics Cooperative  Mood Depressed  Thought Process  Coherency WDL  Content WDL  Delusions None reported or observed  Perception WDL  Hallucination None reported or observed  Judgment Limited  Confusion None  Danger to Self  Current suicidal ideation? Denies  Agreement Not to Harm Self Yes  Description of Agreement verbal contract  Danger to Others  Danger to Others None reported or observed

## 2022-10-30 NOTE — Progress Notes (Signed)
   10/30/22 0545  15 Minute Checks  Location Bedroom  Visual Appearance Calm  Behavior Sleeping  Sleep (Behavioral Health Patients Only)  Calculate sleep? (Click Yes once per 24 hr at 0600 safety check) Yes  Documented sleep last 24 hours 6

## 2022-10-30 NOTE — Group Note (Signed)
Date:  10/30/2022 Time:  12:27 PM  Group Topic/Focus:  Goals Group:   The focus of this group is to help patients establish daily goals to achieve during treatment and discuss how the patient can incorporate goal setting into their daily lives to aide in recovery. Orientation:   The focus of this group is to educate the patient on the purpose and policies of crisis stabilization and provide a format to answer questions about their admission.  The group details unit policies and expectations of patients while admitted.    Participation Level:  Did Not Attend  Participation Quality:   n/a  Affect:   n/a  Cognitive:   n/a  Insight: None  Engagement in Group:   n/a\  Modes of Intervention:   n/a  Additional Comments:   Pt did not attend.  Edmund Hilda Deronte Solis 10/30/2022, 12:27 PM

## 2022-10-30 NOTE — Plan of Care (Signed)
  Problem: Education: Goal: Knowledge of [facility] Education information/materials will improve Outcome: Progressing   Problem: Self-Concept: Goal: Ability to disclose and discuss suicidal ideas will improve Outcome: Progressing

## 2022-10-30 NOTE — Progress Notes (Signed)
[facility] MD Progress Note  10/30/2022 1:32 PM Jeffrey Delgado  MRN:  [id number]  Principal Problem: MDD (major depressive disorder), recurrent episode, severe (HCC)  Diagnosis: Principal Problem:   MDD (major depressive disorder), recurrent episode, severe (HCC) Active Problems:   MDD (major depressive disorder)  Reason for admission:  Jeffrey Delgado is a 22 year old male with prior history of psychiatric diagnoses significant for major depressive disorder, ADHD, history of autism spectrum, who presented voluntarily to [facility] for worsening depression with recurrent suicidal thought with plan to self-harm and end his life and not wake up in the context of break-up with girlfriend.   24-hour chart Review: Staff report patient has not been as visible in milieu or attending groups; he has been going outside and off unit for activities. He remains medication compliant with PRNs given.  No observed behavioral issues noted. Patient slept 6 hours overnight. Has medical history of Type I diabetes with CBG ranges from 200-348 milligrams per deciliter.  Hemoglobin A1c 7.0 on 10/23/2022  Assessment:  Patient initially observed laying in bed in the dark. He is casually dressed; reports last shower was yesterday. Fair eye contact. Monotone, robotic speech. Dysphoric mood, flat affect. Concrete thought processes and repetitive speech.He reports reason for admission was related to recent break up that left him with 'emotional detachment and feelings of abandonment'. He reports breaking up after 2-3 week 'relationship' with a male who is allegedly in a 'complicated' relationship with a 21 year old male bartender whom she works with. He repeats this several times. He reports this is his second relationship and says he 'self sabotaged the relationship by falsely having trust issues with her'.     He reports working at [employer] in [employer] and [employer] do much for fun most recent thing he found fun was street  racing but due to getting a ticket decided not to do it anymore; learned about it from video games and youtube. He reports not feeling like he has any friends and emotionally numb; sees them as 'tools'. He endorses poor sleep quality, waking up several times during the night and as a result is learning to 'micro-sleep'. Staff report patient slept 6 hours overnight. Describes appetite as 'fine. Just a high metabolism makes it hard for him to gain weight'; he expresses frustration at his inability to gain weight or grow muscles despite working out and lifting weights. Currently lives in [place] with his mother and grandfather; doesn't have close relationship with mother due to abandonment issues and history says 'I never felt I got the proper amount of love'. He reports preferring to sit in his room in the dark instead of attending group. He appears more depressed and withdrawn than day prior, denies any specific triggers and doesn't acknowledge the change in his mood. He currently denies any SI/HI/AVH, delusional thoughts or paranoia and does not appear to be responding to any internal stimuli. Care reviewed with attending MD; plan to increase Fluoxetine 40 mg for depressive symptoms. Social work remains involved with managing appointments for aftercare.   Total Time spent with patient:  25 minutes.  Past Psychiatric History: Previous Psych Diagnoses: Yes, MDD, anxiety, ADHD, autism spectrum, developmental delay, DMDD, receptive expressive disorder, and SI Prior inpatient treatment: Yes at [facility], at age 85 to 22 years old due to assaulting police officers and destruction of property. Current/prior outpatient treatment: Patient seeing a counselor currently.   Prior rehab hx: Patient denies Psychotherapy hx: Yes History of suicide: Yes x 1 in the  past History of homicide or aggression: Has history of genocide thoughts Psychiatric medication history: Yes, fluoxetine and Trileptal Psychiatric medication  compliance history: None compliance Neuromodulation history: Denies  Current Psychiatrist: Denies having a psychiatrist Current therapist: Currently seeing a counselor   Past Medical History:  Past Medical History:  Diagnosis Date   ADHD (attention deficit hyperactivity disorder)    Autism spectrum disorder    Developmental delay    Diabetes mellitus without complication (HCC)    Type I   Dysgraphia    History of tympanoplasty of right ear    [facility] 2015   Receptive-expressive language delay     Past Surgical History:  Procedure Laterality Date   ingrown toenails Bilateral    TONSILLECTOMY AND ADENOIDECTOMY     tubes in ear     and another ear surgery to fix the hole where the tubes fell out   Family History:  Family History  Problem Relation Age of Onset   ADD / ADHD Mother    Crohn's disease Mother    Schizophrenia Father    Family Psychiatric  History: Psych: Has history of schizophrenia, mom suffering from paranoia Psych Rx: Yes SA/HA: Dad Substance use family hx: Grandmother smokes cigarettes, and she is alcoholic  Social History:  Social History   Substance and Sexual Activity  Alcohol Use No     Social History   Substance and Sexual Activity  Drug Use No    Social History   Socioeconomic History   Marital status: Single    Spouse name: Not on file   Number of children: Not on file   Years of education: Not on file   Highest education level: Not on file  Occupational History   Not on file  Tobacco Use   Smoking status: Never   Smokeless tobacco: Never  Vaping Use   Vaping Use: Never used  Substance and Sexual Activity   Alcohol use: No   Drug use: No   Sexual activity: Never  Other Topics Concern   Not on file  Social History Narrative   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  Participates in taekwondo   Social Determinants of Health   Financial Resource Strain: Not on file  Food Insecurity: Patient Declined (10/25/2022)   Hunger Vital Sign     Worried About Running Out of Food in the Last Year: Patient declined    Ran Out of Food in the Last Year: Patient declined  Transportation Needs: Patient Declined (10/25/2022)   PRAPARE - Administrator, Civil Service (Medical): Patient declined    Lack of Transportation (Non-Medical): Patient declined  Physical Activity: Not on file  Stress: Not on file  Social Connections: Not on file   Additional Social History:    Sleep: Good  Appetite:  Good  Current Medications: Current Facility-Administered Medications  Medication Dose Route Frequency Provider Last Rate Last Admin   acetaminophen (TYLENOL) tablet 650 mg  650 mg Oral Q6H PRN Oneta Rack, NP       alum & mag hydroxide-simeth (MAALOX/MYLANTA) 200-200-20 MG/5ML suspension 30 mL  30 mL Oral Q4H PRN Oneta Rack, NP       ARIPiprazole (ABILIFY) tablet 2 mg  2 mg Oral Daily Ntuen, Tina C, FNP   2 mg at 10/30/22 0757   diphenhydrAMINE (BENADRYL) capsule 50 mg  50 mg Oral Q8H PRN Ntuen, Jesusita Oka, FNP       Or   diphenhydrAMINE (BENADRYL) injection 50 mg  50 mg Intramuscular Q6H  PRN Cecilie Lowers, FNP       FLUoxetine (PROZAC) capsule 20 mg  20 mg Oral Daily Ntuen, Jesusita Oka, FNP   20 mg at 10/30/22 0758   haloperidol (HALDOL) tablet 5 mg  5 mg Oral Q8H PRN Ntuen, Jesusita Oka, FNP       Or   haloperidol lactate (HALDOL) injection 5 mg  5 mg Intramuscular Q8H PRN Ntuen, Jesusita Oka, FNP       hydrOXYzine (ATARAX) tablet 25 mg  25 mg Oral TID PRN Armandina Stammer I, NP       hydrOXYzine (ATARAX) tablet 25 mg  25 mg Oral QHS Nwoko, Agnes I, NP   25 mg at 10/29/22 2153   insulin aspart (novoLOG) injection 0-15 Units  0-15 Units Subcutaneous TID WC Onuoha, Chinwendu V, NP   5 Units at 10/30/22 1206   insulin aspart (novoLOG) injection 0-5 Units  0-5 Units Subcutaneous QHS Onuoha, Chinwendu V, NP   2 Units at 10/29/22 2153   insulin aspart (novoLOG) injection 3 Units  3 Units Subcutaneous TID WC Armandina Stammer I, NP   3 Units at 10/30/22 1206    insulin glargine-yfgn (SEMGLEE) injection 23 Units  23 Units Subcutaneous Q breakfast Armandina Stammer I, NP   23 Units at 10/30/22 0758   magnesium hydroxide (MILK OF MAGNESIA) suspension 30 mL  30 mL Oral Daily PRN Oneta Rack, NP       melatonin tablet 5 mg  5 mg Oral QHS Nwoko, Agnes I, NP   5 mg at 10/29/22 2153   ondansetron (ZOFRAN-ODT) disintegrating tablet 4 mg  4 mg Oral Q8H PRN Cecilie Lowers, FNP   4 mg at 10/27/22 1610    Lab Results:  Results for orders placed or performed during the hospital encounter of 10/25/22 (from the past 48 hour(s))  Glucose, capillary     Status: Abnormal   Collection Time: 10/28/22  4:44 PM  Result Value Ref Range   Glucose-Capillary 177 (H) 70 - 99 mg/dL    Comment: Glucose reference range applies only to samples taken after fasting for at least 8 hours.  Glucose, capillary     Status: Abnormal   Collection Time: 10/28/22  8:59 PM  Result Value Ref Range   Glucose-Capillary 313 (H) 70 - 99 mg/dL    Comment: Glucose reference range applies only to samples taken after fasting for at least 8 hours.   Comment 1 Notify RN    Comment 2 Document in Chart   Glucose, capillary     Status: Abnormal   Collection Time: 10/29/22  5:53 AM  Result Value Ref Range   Glucose-Capillary 217 (H) 70 - 99 mg/dL    Comment: Glucose reference range applies only to samples taken after fasting for at least 8 hours.   Comment 1 Notify RN    Comment 2 Document in Chart   Glucose, capillary     Status: Abnormal   Collection Time: 10/29/22  8:07 AM  Result Value Ref Range   Glucose-Capillary 354 (H) 70 - 99 mg/dL    Comment: Glucose reference range applies only to samples taken after fasting for at least 8 hours.   Comment 1 Document in Chart   Glucose, capillary     Status: Abnormal   Collection Time: 10/29/22 11:44 AM  Result Value Ref Range   Glucose-Capillary 246 (H) 70 - 99 mg/dL    Comment: Glucose reference range applies only to samples taken after fasting for  at least 8 hours.   Comment 1 Notify RN   Glucose, capillary     Status: Abnormal   Collection Time: 10/29/22  5:02 PM  Result Value Ref Range   Glucose-Capillary 214 (H) 70 - 99 mg/dL    Comment: Glucose reference range applies only to samples taken after fasting for at least 8 hours.  Glucose, capillary     Status: Abnormal   Collection Time: 10/29/22  9:11 PM  Result Value Ref Range   Glucose-Capillary 201 (H) 70 - 99 mg/dL    Comment: Glucose reference range applies only to samples taken after fasting for at least 8 hours.   Comment 1 Notify RN    Comment 2 Document in Chart   Glucose, capillary     Status: Abnormal   Collection Time: 10/30/22  5:59 AM  Result Value Ref Range   Glucose-Capillary 170 (H) 70 - 99 mg/dL    Comment: Glucose reference range applies only to samples taken after fasting for at least 8 hours.   Comment 1 Notify RN    Comment 2 Document in Chart   Glucose, capillary     Status: Abnormal   Collection Time: 10/30/22 12:02 PM  Result Value Ref Range   Glucose-Capillary 242 (H) 70 - 99 mg/dL    Comment: Glucose reference range applies only to samples taken after fasting for at least 8 hours.    Blood Alcohol level:  Lab Results  Component Value Date   ETH <10 10/23/2022   ETH <10 10/20/2019    Metabolic Disorder Labs: Lab Results  Component Value Date   HGBA1C 7.0 (H) 10/23/2022   MPG 154.2 10/23/2022   MPG 128 05/25/2021   Lab Results  Component Value Date   PROLACTIN 5.3 10/23/2022   Lab Results  Component Value Date   CHOL 188 10/23/2022   TRIG 66 10/23/2022   HDL 69 10/23/2022   CHOLHDL 2.7 10/23/2022   VLDL 13 10/23/2022   LDLCALC 106 (H) 10/23/2022    Physical Findings: AIMS: Facial and Oral Movements Muscles of Facial Expression: None, normal Lips and Perioral Area: None, normal Jaw: None, normal Tongue: None, normal,Extremity Movements Upper (arms, wrists, hands, fingers): None, normal Lower (legs, knees, ankles, toes):  None, normal, Trunk Movements Neck, shoulders, hips: None, normal, Overall Severity Severity of abnormal movements (highest score from questions above): None, normal Incapacitation due to abnormal movements: None, normal Patient's awareness of abnormal movements (rate only patient's report): No Awareness, Dental Status Current problems with teeth and/or dentures?: No Does patient usually wear dentures?: No  CIWA:    COWS:     Musculoskeletal: Strength & Muscle Tone: within normal limits Gait & Station: normal Patient leans: N/A  Psychiatric Specialty Exam:  Presentation  General Appearance:  Disheveled  Eye Contact: Poor  Speech: Clear and Coherent; Normal Rate  Speech Volume: Normal  Handedness: Right  Mood and Affect  Mood: Hopeless; Depressed  Affect: Flat; Congruent  Thought Process  Thought Processes: Coherent  Descriptions of Associations:Intact  Orientation:Full (Time, Place and Person)  Thought Content:Logical  History of Schizophrenia/Schizoaffective disorder:No  Duration of Psychotic Symptoms:No data recorded Hallucinations:Hallucinations: None   Ideas of Reference:None  Suicidal Thoughts:Suicidal Thoughts: No SI Passive Intent and/or Plan: Without Intent; Without Means to Carry Out; Without Plan; Without Access to Means   Homicidal Thoughts:Homicidal Thoughts: No   Sensorium  Memory: Immediate Good; Recent Good; Remote Good  Judgment: Impaired  Insight: Shallow; Poor  Executive Functions  Concentration: Fair  Attention  Span: Fair  Recall: [employer] of Knowledge: Poor  Language: Fair  Psychomotor Activity  Psychomotor Activity: Psychomotor Activity: Normal   Assets  Assets: Communication Skills; Physical Health; Resilience  Sleep  Sleep: Sleep: Fair Number of Hours of Sleep: 7.5   Physical Exam: Physical Exam Vitals and nursing note reviewed.  HENT:     Head: Normocephalic.     Nose: Nose normal.      Mouth/Throat:     Mouth: Mucous membranes are moist.     Pharynx: Oropharynx is clear.  Eyes:     Conjunctiva/sclera: Conjunctivae normal.     Pupils: Pupils are equal, round, and reactive to light.  Cardiovascular:     Rate and Rhythm: Normal rate.     Pulses: Normal pulses.  Pulmonary:     Effort: Pulmonary effort is normal.  Genitourinary:    Comments: Deferred Musculoskeletal:        General: Normal range of motion.     Cervical back: Normal range of motion.  Skin:    General: Skin is warm and dry.  Neurological:     General: No focal deficit present.     Mental Status: He is alert and oriented to person, place, and time.  Psychiatric:        Attention and Perception: He does not perceive auditory or visual hallucinations.        Mood and Affect: Mood is depressed. Affect is blunt and flat.        Behavior: Behavior is withdrawn.        Thought Content: Thought content is delusional.        Judgment: Judgment is impulsive and inappropriate.    Review of Systems  Constitutional:  Negative for fever.  HENT:  Negative for congestion and sore throat.   Eyes: Negative.   Respiratory:  Negative for cough, shortness of breath and wheezing.   Cardiovascular:  Negative for chest pain and palpitations.       History of diabetes type 1  Gastrointestinal: Negative.  Negative for abdominal pain, constipation, diarrhea, nausea and vomiting.       Hx of diabetes x 1  Genitourinary: Negative.   Musculoskeletal: Negative.   Skin: Negative.   Neurological:  Negative for dizziness, tingling, tremors, sensory change, speech change, focal weakness, seizures, loss of consciousness, weakness and headaches.  Endo/Heme/Allergies: Negative.        See allergy listing  Psychiatric/Behavioral:  Positive for depression. The patient is nervous/anxious and has insomnia.    Blood pressure 130/76, pulse (!) 101, temperature 98.1 F (36.7 C), temperature source Oral, resp. rate 16, height 5\' 6"   (1.676 m), weight 59.9 kg, SpO2 100 %. Body mass index is 21.31 kg/m.                                                                                           Treatment Plan Summary: Daily contact with patient to assess and evaluate symptoms and progress in treatment and Medication management  Continue inpatient hospitalization.  Will continue today 10/30/2022 plan as below except where it is noted.    Physician Treatment Plan for Primary  Diagnosis:  MDD major depressive disorder recurrent severe without psychosis DMDD disruptive mood dysregulation disorder Suicidal ideation   Plan: Medication: Increase:  Fluoxetine 40 mg po qd for depression.  Continue:  Melatonin 5 mg PO qHS  insomnia . Abilify 2 mg po qd augment antidepressant/augmentation.  Hydroxyzine 25 mg PO qHS insomnia.   Agitation protocol: Benadryl 50 mg PO/IM, Haldol 5 mg PO/IM 3 times daily as needed agitation.   Medical issues.  Continue SEMGLEE 23 units SubQ daily with breakfast for DM.  Continue CBG/sliding insulin coverage as recommended for DM.  Continue Novolog insulin 3 units tid with meals for DM    Other PRN Medications -Acetaminophen 650 mg every 6 as needed/mild pain -Maalox 30 mL oral every 4 as needed/digestion -Magnesium hydroxide 30 mL daily as needed/mild constipation --Hydroxyzine 25 mg p.o. 3 times daily as needed anxiety  Safety and Monitoring: Voluntary admission to inpatient psychiatric unit for safety, stabilization and treatment Daily contact with patient to assess and evaluate symptoms and progress in treatment Patient's case to be discussed in multi-disciplinary team meeting Observation Level : q15 minute checks Vital signs: q12 hours Precautions: suicide, but pt currently verbally contracts for safety on unit    Discharge Planning: Social work and case management to assist with discharge planning and identification of hospital follow-up needs prior to discharge Estimated LOS: 5-7  days Discharge Concerns: Need to establish a safety plan; Medication compliance and effectiveness Discharge Goals: Return home with outpatient referrals for mental health follow-up including medication management/psychotherapy.    I certify that inpatient services furnished can reasonably be expected to improve the patient's condition.     Loletta Parish, NP 10/30/2022, 1:32 PM  Patient ID: Jeffrey Delgado, male   DOB: [birth year], 21 y.o.   MRN: [id number]

## 2022-10-30 NOTE — Progress Notes (Signed)
   10/30/22 2130  Psych Admission Type (Psych Patients Only)  Admission Status Voluntary  Psychosocial Assessment  Patient Complaints Depression  Eye Contact Fair  Facial Expression Flat  Affect Flat  Speech Logical/coherent  Interaction Minimal  Motor Activity Slow  Appearance/Hygiene Unremarkable  Behavior Characteristics Cooperative  Mood Depressed  Thought Process  Coherency WDL  Content WDL  Delusions None reported or observed  Perception WDL  Hallucination None reported or observed  Judgment Limited  Confusion None  Danger to Self  Current suicidal ideation? Denies  Agreement Not to Harm Self Yes  Description of Agreement Verbal contract for safety  Danger to Others  Danger to Others None reported or observed   Pt reports 0/10 anxiety and 7/10 depression. Pt was offered support and encouragement. Pt was given scheduled medications. Q 15 minute checks were done for safety. Pt attended group and interacts well with peers and staff. Pt has no complaints. Pt receptive to treatment and safety maintained on unit.

## 2022-10-30 NOTE — Progress Notes (Signed)
   10/29/22 2100  Psych Admission Type (Psych Patients Only)  Admission Status Voluntary  Psychosocial Assessment  Patient Complaints Depression  Eye Contact Fair  Facial Expression Flat  Affect Flat  Speech Logical/coherent  Interaction Minimal  Motor Activity Slow  Appearance/Hygiene In hospital gown  Behavior Characteristics Cooperative  Mood Depressed  Thought Process  Coherency WDL  Content WDL  Delusions None reported or observed  Perception WDL  Hallucination None reported or observed  Judgment Limited  Confusion None  Danger to Self  Current suicidal ideation? Denies  Agreement Not to Harm Self Yes  Description of Agreement Verbal contract

## 2022-10-31 LAB — GLUCOSE, CAPILLARY
Glucose-Capillary: 103 mg/dL — ABNORMAL HIGH (ref 70–99)
Glucose-Capillary: 234 mg/dL — ABNORMAL HIGH (ref 70–99)
Glucose-Capillary: 164 mg/dL — ABNORMAL HIGH (ref 70–99)

## 2022-10-31 MED ORDER — ARIPIPRAZOLE 5 MG PO TABS
5.0000 mg | ORAL_TABLET | Freq: Every day | ORAL | Status: DC
  Administered 2022-11-01 – 2022-11-03 (×3): 5 mg via ORAL
  Filled 2022-10-31 (×5): qty 1

## 2022-10-31 MED ORDER — TRAZODONE HCL 50 MG PO TABS
50.0000 mg | ORAL_TABLET | Freq: Every evening | ORAL | Status: DC | PRN
  Administered 2022-10-31 – 2022-11-02 (×2): 50 mg via ORAL
  Filled 2022-10-31 (×2): qty 1

## 2022-10-31 NOTE — Progress Notes (Signed)
   10/31/22 0555  15 Minute Checks  Location Bedroom  Visual Appearance Calm  Behavior Sleeping  Sleep (Behavioral Health Patients Only)  Calculate sleep? (Click Yes once per 24 hr at 0600 safety check) Yes  Documented sleep last 24 hours 7.5

## 2022-10-31 NOTE — Group Note (Signed)
Date:  10/31/2022 Time:  1:17 PM  Group Topic/Focus:  Orientation:   The focus of this group is to educate the patient on the purpose and policies of crisis stabilization and provide a format to answer questions about their admission.  The group details unit policies and expectations of patients while admitted.    Participation Level:  Active  Participation Quality:  Appropriate  Affect:  Appropriate  Cognitive:  Appropriate  Insight: Appropriate  Engagement in Group:  Engaged  Modes of Intervention:  Discussion  Additional Comments:     Reymundo Poll 10/31/2022, 1:17 PM

## 2022-10-31 NOTE — Progress Notes (Signed)
[facility] MD Progress Note  10/31/2022 3:46 PM Jeffrey Delgado  MRN:  [id number]  Principal Problem: MDD (major depressive disorder), recurrent episode, severe (HCC)  Diagnosis: Principal Problem:   MDD (major depressive disorder), recurrent episode, severe (HCC) Active Problems:   MDD (major depressive disorder)  Reason for admission:  Jeffrey Delgado is a 22 year old male with prior history of psychiatric diagnoses significant for major depressive disorder, ADHD, history of autism spectrum, who presented voluntarily to [facility] for worsening depression with recurrent suicidal thought with plan to self-harm and end his life and not wake up in the context of break-up with girlfriend.   24-hour chart Review: Staff report patient has been more visible in milieu or attending groups and off unit for activities. He remains medication compliant with PRNs given.  No observed behavioral issues noted. Patient slept 6 hours overnight. Has medical history of Type I diabetes with CBG ranges from 200-348 milligrams per deciliter.  Hemoglobin A1c 7.0 on 10/23/2022  Assessment:  Patient initially observed laying in bed in the dark. He is casually dressed; improved attention to ADLs noted toady. Hair is neatly braided. He is wearing a mask over his face today that he says is to 'hide his emotions'. He reports showering last night. Fair eye contact. Monotone, robotic speech. Dysphoric mood, flat affect. Improved affect. He remains concrete and rigid thought processes; less repetitive speech. Today he reports that he is 'trying to heal about the situation. I feel misread and misunderstood'. He remains hyperfocused on male and states he is 'hoping for a second chance' when questioned if she is not willing to do so his reaction, he states 'I don't want to lose my shit'. Provider spent time processing with patient discussing coping mechanisms, accepting no, and self-care where he reports experiencing bullying since  elementary school where he was often ostracized and unloved; says the behavior continued through middle and high school where he says he's been 'numb' to the world. He reports father is schizophrenic and his mother suffers from delusional and paranoid thoughts. He continues to endorse negative thoughts about himself and hopeless outlook on his life. He rates his depression 7/10 on a scale of 0-10 with 10 being the worse. Provider is able to talk about goal setting where he brightens slightly when he discusses meeting his goal yesterday of coming out of his room more and attending groups; today's goal is to journal and document positive things about himself. He denies any SI/HI/AVH today, reports having some intrusive thoughts last night affecting his ability to sleep overnight. He remains medication compliant and denies any side effects at this time. Care reviewed with attending MD; Fluoxetine was increased Saturday to 40 mg for depressive symptoms; plan to increase Abilify to 5 mg today for continued depression and flattening, adding Trazodone 50 mg PRN for insomnia. Will continue to monitor patient's response.  Social work remains involved with managing appointments for aftercare.   Total Time spent with patient:  25 minutes.  Past Psychiatric History: Previous Psych Diagnoses: Yes, MDD, anxiety, ADHD, autism spectrum, developmental delay, DMDD, receptive expressive disorder, and SI Prior inpatient treatment: Yes at [facility], at age 85 to 22 years old due to assaulting police officers and destruction of property. Current/prior outpatient treatment: Patient seeing a counselor currently.   Prior rehab hx: Patient denies Psychotherapy hx: Yes History of suicide: Yes x 1 in the past History of homicide or aggression: Has history of genocide thoughts Psychiatric medication history: Yes, fluoxetine and Trileptal Psychiatric medication  compliance history: None compliance Neuromodulation history: Denies  Current  Psychiatrist: Denies having a psychiatrist Current therapist: Currently seeing a counselor   Past Medical History:  Past Medical History:  Diagnosis Date   ADHD (attention deficit hyperactivity disorder)    Autism spectrum disorder    Developmental delay    Diabetes mellitus without complication (HCC)    Type I   Dysgraphia    History of tympanoplasty of right ear    [facility] 2015   Receptive-expressive language delay     Past Surgical History:  Procedure Laterality Date   ingrown toenails Bilateral    TONSILLECTOMY AND ADENOIDECTOMY     tubes in ear     and another ear surgery to fix the hole where the tubes fell out   Family History:  Family History  Problem Relation Age of Onset   ADD / ADHD Mother    Crohn's disease Mother    Schizophrenia Father    Family Psychiatric  History: Psych: Has history of schizophrenia, mom suffering from paranoia Psych Rx: Yes SA/HA: Dad Substance use family hx: Grandmother smokes cigarettes, and she is alcoholic  Social History:  Social History   Substance and Sexual Activity  Alcohol Use No     Social History   Substance and Sexual Activity  Drug Use No    Social History   Socioeconomic History   Marital status: Single    Spouse name: Not on file   Number of children: Not on file   Years of education: Not on file   Highest education level: Not on file  Occupational History   Not on file  Tobacco Use   Smoking status: Never   Smokeless tobacco: Never  Vaping Use   Vaping Use: Never used  Substance and Sexual Activity   Alcohol use: No   Drug use: No   Sexual activity: Never  Other Topics Concern   Not on file  Social History Narrative   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  Participates in taekwondo   Social Determinants of Health   Financial Resource Strain: Not on file  Food Insecurity: Patient Declined (10/25/2022)   Hunger Vital Sign    Worried About Running Out of Food in the Last Year: Patient declined    Ran Out  of Food in the Last Year: Patient declined  Transportation Needs: Patient Declined (10/25/2022)   PRAPARE - Administrator, Civil Service (Medical): Patient declined    Lack of Transportation (Non-Medical): Patient declined  Physical Activity: Not on file  Stress: Not on file  Social Connections: Not on file   Additional Social History:    Sleep: Good  Appetite:  Good  Current Medications: Current Facility-Administered Medications  Medication Dose Route Frequency Provider Last Rate Last Admin   acetaminophen (TYLENOL) tablet 650 mg  650 mg Oral Q6H PRN Oneta Rack, NP       alum & mag hydroxide-simeth (MAALOX/MYLANTA) 200-200-20 MG/5ML suspension 30 mL  30 mL Oral Q4H PRN Oneta Rack, NP       [START ON 11/01/2022] ARIPiprazole (ABILIFY) tablet 5 mg  5 mg Oral Daily Leevy-Johnson, Remmy Crass A, NP       diphenhydrAMINE (BENADRYL) capsule 50 mg  50 mg Oral Q8H PRN Ntuen, Jesusita Oka, FNP       Or   diphenhydrAMINE (BENADRYL) injection 50 mg  50 mg Intramuscular Q6H PRN Ntuen, Tina C, FNP       FLUoxetine (PROZAC) capsule 40 mg  40 mg Oral  Daily Leevy-Johnson, Cantrell Larouche A, NP   40 mg at 10/31/22 9604   haloperidol (HALDOL) tablet 5 mg  5 mg Oral Q8H PRN Ntuen, Jesusita Oka, FNP       Or   haloperidol lactate (HALDOL) injection 5 mg  5 mg Intramuscular Q8H PRN Ntuen, Jesusita Oka, FNP       hydrOXYzine (ATARAX) tablet 25 mg  25 mg Oral TID PRN Armandina Stammer I, NP       hydrOXYzine (ATARAX) tablet 25 mg  25 mg Oral QHS Nwoko, Agnes I, NP   25 mg at 10/30/22 2128   insulin aspart (novoLOG) injection 0-15 Units  0-15 Units Subcutaneous TID WC Onuoha, Chinwendu V, NP   5 Units at 10/31/22 1255   insulin aspart (novoLOG) injection 0-5 Units  0-5 Units Subcutaneous QHS Onuoha, Chinwendu V, NP   3 Units at 10/30/22 2130   insulin aspart (novoLOG) injection 3 Units  3 Units Subcutaneous TID WC Armandina Stammer I, NP   3 Units at 10/31/22 1256   insulin glargine-yfgn (SEMGLEE) injection 23 Units  23 Units  Subcutaneous Q breakfast Armandina Stammer I, NP   23 Units at 10/31/22 5409   magnesium hydroxide (MILK OF MAGNESIA) suspension 30 mL  30 mL Oral Daily PRN Oneta Rack, NP       melatonin tablet 5 mg  5 mg Oral QHS Nwoko, Agnes I, NP   5 mg at 10/30/22 2128   ondansetron (ZOFRAN-ODT) disintegrating tablet 4 mg  4 mg Oral Q8H PRN Cecilie Lowers, FNP   4 mg at 10/27/22 8119    Lab Results:  Results for orders placed or performed during the hospital encounter of 10/25/22 (from the past 48 hour(s))  Glucose, capillary     Status: Abnormal   Collection Time: 10/29/22  5:02 PM  Result Value Ref Range   Glucose-Capillary 214 (H) 70 - 99 mg/dL    Comment: Glucose reference range applies only to samples taken after fasting for at least 8 hours.  Glucose, capillary     Status: Abnormal   Collection Time: 10/29/22  9:11 PM  Result Value Ref Range   Glucose-Capillary 201 (H) 70 - 99 mg/dL    Comment: Glucose reference range applies only to samples taken after fasting for at least 8 hours.   Comment 1 Notify RN    Comment 2 Document in Chart   Glucose, capillary     Status: Abnormal   Collection Time: 10/30/22  5:59 AM  Result Value Ref Range   Glucose-Capillary 170 (H) 70 - 99 mg/dL    Comment: Glucose reference range applies only to samples taken after fasting for at least 8 hours.   Comment 1 Notify RN    Comment 2 Document in Chart   Glucose, capillary     Status: Abnormal   Collection Time: 10/30/22 12:02 PM  Result Value Ref Range   Glucose-Capillary 242 (H) 70 - 99 mg/dL    Comment: Glucose reference range applies only to samples taken after fasting for at least 8 hours.  Glucose, capillary     Status: Abnormal   Collection Time: 10/30/22  5:11 PM  Result Value Ref Range   Glucose-Capillary 199 (H) 70 - 99 mg/dL    Comment: Glucose reference range applies only to samples taken after fasting for at least 8 hours.  Glucose, capillary     Status: Abnormal   Collection Time: 10/30/22  8:56  PM  Result Value Ref Range  Glucose-Capillary 294 (H) 70 - 99 mg/dL    Comment: Glucose reference range applies only to samples taken after fasting for at least 8 hours.   Comment 1 Notify RN    Comment 2 Document in Chart   Glucose, capillary     Status: Abnormal   Collection Time: 10/31/22  5:55 AM  Result Value Ref Range   Glucose-Capillary 103 (H) 70 - 99 mg/dL    Comment: Glucose reference range applies only to samples taken after fasting for at least 8 hours.   Comment 1 Notify RN    Comment 2 Document in Chart   Glucose, capillary     Status: Abnormal   Collection Time: 10/31/22 12:05 PM  Result Value Ref Range   Glucose-Capillary 234 (H) 70 - 99 mg/dL    Comment: Glucose reference range applies only to samples taken after fasting for at least 8 hours.    Blood Alcohol level:  Lab Results  Component Value Date   ETH <10 10/23/2022   ETH <10 10/20/2019    Metabolic Disorder Labs: Lab Results  Component Value Date   HGBA1C 7.0 (H) 10/23/2022   MPG 154.2 10/23/2022   MPG 128 05/25/2021   Lab Results  Component Value Date   PROLACTIN 5.3 10/23/2022   Lab Results  Component Value Date   CHOL 188 10/23/2022   TRIG 66 10/23/2022   HDL 69 10/23/2022   CHOLHDL 2.7 10/23/2022   VLDL 13 10/23/2022   LDLCALC 106 (H) 10/23/2022    Physical Findings: AIMS: Facial and Oral Movements Muscles of Facial Expression: None, normal Lips and Perioral Area: None, normal Jaw: None, normal Tongue: None, normal,Extremity Movements Upper (arms, wrists, hands, fingers): None, normal Lower (legs, knees, ankles, toes): None, normal, Trunk Movements Neck, shoulders, hips: None, normal, Overall Severity Severity of abnormal movements (highest score from questions above): None, normal Incapacitation due to abnormal movements: None, normal Patient's awareness of abnormal movements (rate only patient's report): No Awareness, Dental Status Current problems with teeth and/or dentures?:  No Does patient usually wear dentures?: No  CIWA:    COWS:     Musculoskeletal: Strength & Muscle Tone: within normal limits Gait & Station: normal Patient leans: N/A  Psychiatric Specialty Exam:  Presentation  General Appearance:  Disheveled  Eye Contact: Fair  Speech: Clear and Coherent; Normal Rate  Speech Volume: Normal  Handedness: Right  Mood and Affect  Mood: Hopeless; Depressed  Affect: Flat; Constricted  Thought Process  Thought Processes: Coherent  Descriptions of Associations:Loose  Orientation:Full (Time, Place and Person)  Thought Content:Illogical  History of Schizophrenia/Schizoaffective disorder:No  Duration of Psychotic Symptoms:No data recorded Hallucinations:Hallucinations: None   Ideas of Reference:None  Suicidal Thoughts:Suicidal Thoughts: No   Homicidal Thoughts:Homicidal Thoughts: No   Sensorium  Memory: Immediate Good; Recent Good  Judgment: Impaired  Insight: Poor; Shallow  Executive Functions  Concentration: Fair  Attention Span: Fair  Recall: [employer] of Knowledge: Poor  Language: Fair  Psychomotor Activity  Psychomotor Activity: Psychomotor Activity: Normal   Assets  Assets: Communication Skills; Physical Health; Resilience  Sleep  Sleep: Sleep: Fair   Physical Exam: Physical Exam Vitals and nursing note reviewed.  HENT:     Head: Normocephalic.     Nose: Nose normal.     Mouth/Throat:     Mouth: Mucous membranes are moist.     Pharynx: Oropharynx is clear.  Eyes:     Conjunctiva/sclera: Conjunctivae normal.     Pupils: Pupils are equal, round, and  reactive to light.  Cardiovascular:     Rate and Rhythm: Normal rate.     Pulses: Normal pulses.  Pulmonary:     Effort: Pulmonary effort is normal.  Genitourinary:    Comments: Deferred Musculoskeletal:        General: Normal range of motion.     Cervical back: Normal range of motion.  Skin:    General: Skin is warm and  dry.  Neurological:     General: No focal deficit present.     Mental Status: He is alert and oriented to person, place, and time.  Psychiatric:        Attention and Perception: He does not perceive auditory or visual hallucinations.        Mood and Affect: Mood is depressed. Affect is blunt and flat.        Behavior: Behavior is withdrawn.        Thought Content: Thought content is delusional.        Judgment: Judgment is impulsive and inappropriate.    Review of Systems  Constitutional:  Negative for fever.  HENT:  Negative for congestion and sore throat.   Eyes: Negative.   Respiratory:  Negative for cough, shortness of breath and wheezing.   Cardiovascular:  Negative for chest pain and palpitations.       History of diabetes type 1  Gastrointestinal: Negative.  Negative for abdominal pain, constipation, diarrhea, nausea and vomiting.       Hx of diabetes x 1  Genitourinary: Negative.   Musculoskeletal: Negative.   Skin: Negative.   Neurological:  Negative for dizziness, tingling, tremors, sensory change, speech change, focal weakness, seizures, loss of consciousness, weakness and headaches.  Endo/Heme/Allergies: Negative.        See allergy listing  Psychiatric/Behavioral:  Positive for depression. The patient is nervous/anxious and has insomnia.    Blood pressure 137/87, pulse 73, temperature 97.8 F (36.6 C), temperature source Oral, resp. rate 16, height 5\' 6"  (1.676 m), weight 59.9 kg, SpO2 100 %. Body mass index is 21.31 kg/m.                                                                                           Treatment Plan Summary: Daily contact with patient to assess and evaluate symptoms and progress in treatment and Medication management  Continue inpatient hospitalization.  Will continue today 10/31/2022 plan as below except where it is noted.    Physician Treatment Plan for Primary Diagnosis:  MDD major depressive disorder recurrent severe without  psychosis DMDD disruptive mood dysregulation disorder Suicidal ideation   Plan: Medication: Discontinue:  Hydroxyzine 25 mg PO qHS insomnia; appears ineffective  Start:  Trazodone 50 mg PO HS PRN insomnia  Increase:  Abilify 5 mg po qd augment antidepressant/augmentation.  Continue:  Melatonin 5 mg PO qHS  insomnia . Fluoxetine 40 mg po qd for depression   Agitation protocol: Benadryl 50 mg PO/IM, Haldol 5 mg PO/IM 3 times daily as needed agitation.   Medical issues.  Continue SEMGLEE 23 units SubQ daily with breakfast for DM.  Continue CBG/sliding insulin coverage as recommended for DM.  Continue Novolog insulin 3 units tid with meals for DM    Other PRN Medications -Acetaminophen 650 mg every 6 as needed/mild pain -Maalox 30 mL oral every 4 as needed/digestion -Magnesium hydroxide 30 mL daily as needed/mild constipation --Hydroxyzine 25 mg p.o. 3 times daily as needed anxiety  Safety and Monitoring: Voluntary admission to inpatient psychiatric unit for safety, stabilization and treatment Daily contact with patient to assess and evaluate symptoms and progress in treatment Patient's case to be discussed in multi-disciplinary team meeting Observation Level : q15 minute checks Vital signs: q12 hours Precautions: suicide, but pt currently verbally contracts for safety on unit    Discharge Planning: Social work and case management to assist with discharge planning and identification of hospital follow-up needs prior to discharge Estimated LOS: 5-7 days Discharge Concerns: Need to establish a safety plan; Medication compliance and effectiveness Discharge Goals: Return home with outpatient referrals for mental health follow-up including medication management/psychotherapy.    I certify that inpatient services furnished can reasonably be expected to improve the patient's condition.     Loletta Parish, NP 10/31/2022, 3:46 PM  Patient ID: Jeffrey Delgado, male    DOB: 2000-12-20, 21 y.o.   MRN: [id number]

## 2022-10-31 NOTE — [facility] Group Notes (Signed)
[facility] Group Notes:  (Nursing/MHT/Case Management/Adjunct)  Date:  10/31/2022  Time:  4:34 AM  Type of Therapy:   Wrap-up group  Participation Level:  Active  Participation Quality:  Appropriate  Affect:  Appropriate  Cognitive:  Appropriate  Insight:  Appropriate  Engagement in Group:  Engaged  Modes of Intervention:  Education  Summary of Progress/Problems: Pt goal to heal, day 5/10.  Noah Delaine 10/31/2022, 4:34 AM

## 2022-10-31 NOTE — Progress Notes (Signed)
Coda "[name]" rates sleep as good. He denies SI/HI/AVH. Pt appears isolative, states he doesn't like being in large crowds. Pt was flat and guarded on presentation. Pt states he wears a face mask because it is a "coping mechanism". Pt remains safe.

## 2022-11-01 ENCOUNTER — Encounter (HOSPITAL_COMMUNITY): Payer: Self-pay

## 2022-11-01 LAB — GLUCOSE, CAPILLARY
Glucose-Capillary: 348 mg/dL — ABNORMAL HIGH (ref 70–99)
Glucose-Capillary: 235 mg/dL — ABNORMAL HIGH (ref 70–99)
Glucose-Capillary: 287 mg/dL — ABNORMAL HIGH (ref 70–99)
Glucose-Capillary: 286 mg/dL — ABNORMAL HIGH (ref 70–99)
Glucose-Capillary: 206 mg/dL — ABNORMAL HIGH (ref 70–99)

## 2022-11-01 NOTE — Progress Notes (Signed)
   11/01/22 2140  Psych Admission Type (Psych Patients Only)  Admission Status Voluntary  Psychosocial Assessment  Patient Complaints Depression  Eye Contact Fair  Facial Expression Flat  Affect Apprehensive;Anxious  Speech Logical/coherent  Interaction Minimal  Motor Activity Other (Comment) (WNL)  Appearance/Hygiene Unremarkable  Behavior Characteristics Cooperative;Appropriate to situation  Mood Depressed;Anxious  Thought Process  Coherency WDL  Content WDL  Delusions None reported or observed  Perception WDL  Hallucination None reported or observed  Judgment Limited  Confusion None  Danger to Self  Current suicidal ideation? Denies  Self-Injurious Behavior No self-injurious ideation or behavior indicators observed or expressed   Agreement Not to Harm Self Yes  Description of Agreement Verbal contract for safety  Danger to Others  Danger to Others None reported or observed   Patient reports 0/10 anxiety and 6/10 depression. Pt was offered support and encouragement. Pt was given scheduled medications. Q 15 minute checks were done for safety. Pt attended group and interacts well with peers and staff.  Pt has no complaints.Pt receptive to treatment and safety maintained on unit.

## 2022-11-01 NOTE — Plan of Care (Signed)
Nurse discussed coping skills with patient.  

## 2022-11-01 NOTE — Progress Notes (Signed)
[facility] MD Progress Note  11/01/2022 4:33 PM ABIMAEL DOHRN  MRN:  [id number]  Principal Problem: MDD (major depressive disorder), recurrent episode, severe (HCC)  Diagnosis: Principal Problem:   MDD (major depressive disorder), recurrent episode, severe (HCC) Active Problems:   MDD (major depressive disorder)  Reason for admission:  Arsal A. Fagot is a 22 year old male with prior history of psychiatric diagnoses significant for major depressive disorder, ADHD, history of autism spectrum, who presented voluntarily to [facility] for worsening depression with recurrent suicidal thought with plan to self-harm and end his life and not wake up in the context of break-up with girlfriend.   24-hour chart Review: Staff report patient has been more visible in milieu or attending groups and off unit for activities. He remains medication compliant with PRNs given trazodone 50 mg p.o. given at 2118 for insomnia yesterday, and Tylenol 650 mg given today at 1412 for mild pain No observed behavioral issues noted. Patient slept 6 hours overnight. Has medical history of Type I diabetes with CBG ranges from 200-348 milligrams per deciliter.  Hemoglobin A1c 7.0 on 10/23/2022  Today's assessment notes:  Patient is visible on the unit, observed interacting with other patients in the dayroom.  He is called to the office for evaluation and sitting in a chair in the office.  He is casually dressed; improved attention to ADLs noted today. Hair is neatly braided on the side. He is wearing a mask over his face today that he says is to 'hide his emotions', however, able to remove mask for assessment during the examination table provide-request. He reports showering yesterday. Fair eye contact with clear speech.  Mood is less depressed,  with flat  affect however, improved. He remains concrete in thinking which less repetitive speech. He reports father is schizophrenic and his mother suffers from delusional and paranoid  thoughts. He continues to endorse negative thoughts about himself and hopeless outlook on his life. He rates his depression 8/10 on a scale of 0-10 with 10 being the worse.  Denies symptoms of anxiety.  He denies any SI/HI/AVH today. He remains medication compliant and denies any side effects at this time. Care reviewed with attending psychiatrist.  He continues on fluoxetine 40 mg for depressive symptoms; Abilify 5 mg  for continued depression and flattening affect, and Trazodone 50 mg PRN for insomnia. Will continue to monitor patient's response.  Social work remains involved with managing appointments for close discharge care.   Total Time spent with patient:  35 minutes.  Past Psychiatric History: Previous Psych Diagnoses: Yes, MDD, anxiety, ADHD, autism spectrum, developmental delay, DMDD, receptive expressive disorder, and SI Prior inpatient treatment: Yes at [facility], at age 25 to 22 years old due to assaulting police officers and destruction of property. Current/prior outpatient treatment: Patient seeing a counselor currently.   Prior rehab hx: Patient denies Psychotherapy hx: Yes History of suicide: Yes x 1 in the past History of homicide or aggression: Has history of genocide thoughts Psychiatric medication history: Yes, fluoxetine and Trileptal Psychiatric medication compliance history: None compliance Neuromodulation history: Denies  Current Psychiatrist: Denies having a psychiatrist Current therapist: Currently seeing a counselor   Past Medical History:  Past Medical History:  Diagnosis Date   ADHD (attention deficit hyperactivity disorder)    Autism spectrum disorder    Developmental delay    Diabetes mellitus without complication (HCC)    Type I   Dysgraphia    History of tympanoplasty of right ear    [facility] 2015  Receptive-expressive language delay     Past Surgical History:  Procedure Laterality Date   ingrown toenails Bilateral    TONSILLECTOMY AND ADENOIDECTOMY      tubes in ear     and another ear surgery to fix the hole where the tubes fell out   Family History:  Family History  Problem Relation Age of Onset   ADD / ADHD Mother    Crohn's disease Mother    Schizophrenia Father    Family Psychiatric  History: Psych: Has history of schizophrenia, mom suffering from paranoia Psych Rx: Yes SA/HA: Dad Substance use family hx: Grandmother smokes cigarettes, and she is alcoholic  Social History:  Social History   Substance and Sexual Activity  Alcohol Use No     Social History   Substance and Sexual Activity  Drug Use No    Social History   Socioeconomic History   Marital status: Single    Spouse name: Not on file   Number of children: Not on file   Years of education: Not on file   Highest education level: Not on file  Occupational History   Not on file  Tobacco Use   Smoking status: Never   Smokeless tobacco: Never  Vaping Use   Vaping Use: Never used  Substance and Sexual Activity   Alcohol use: No   Drug use: No   Sexual activity: Never  Other Topics Concern   Not on file  Social History Narrative   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  Participates in taekwondo   Social Determinants of Health   Financial Resource Strain: Not on file  Food Insecurity: Patient Declined (10/25/2022)   Hunger Vital Sign    Worried About Running Out of Food in the Last Year: Patient declined    Ran Out of Food in the Last Year: Patient declined  Transportation Needs: Patient Declined (10/25/2022)   PRAPARE - Administrator, Civil Service (Medical): Patient declined    Lack of Transportation (Non-Medical): Patient declined  Physical Activity: Not on file  Stress: Not on file  Social Connections: Not on file   Additional Social History:    Sleep: Good  Appetite:  Good  Current Medications: Current Facility-Administered Medications  Medication Dose Route Frequency Provider Last Rate Last Admin   acetaminophen (TYLENOL) tablet 650  mg  650 mg Oral Q6H PRN Oneta Rack, NP   650 mg at 11/01/22 1412   alum & mag hydroxide-simeth (MAALOX/MYLANTA) 200-200-20 MG/5ML suspension 30 mL  30 mL Oral Q4H PRN Oneta Rack, NP       ARIPiprazole (ABILIFY) tablet 5 mg  5 mg Oral Daily Leevy-Johnson, Brooke A, NP   5 mg at 11/01/22 0809   diphenhydrAMINE (BENADRYL) capsule 50 mg  50 mg Oral Q8H PRN Mylei Brackeen, Jesusita Oka, FNP       Or   diphenhydrAMINE (BENADRYL) injection 50 mg  50 mg Intramuscular Q6H PRN Rip Hawes, Jesusita Oka, FNP       FLUoxetine (PROZAC) capsule 40 mg  40 mg Oral Daily Leevy-Johnson, Brooke A, NP   40 mg at 11/01/22 0809   haloperidol (HALDOL) tablet 5 mg  5 mg Oral Q8H PRN Levii Hairfield, Jesusita Oka, FNP       Or   haloperidol lactate (HALDOL) injection 5 mg  5 mg Intramuscular Q8H PRN Emarion Toral, Jesusita Oka, FNP       hydrOXYzine (ATARAX) tablet 25 mg  25 mg Oral TID PRN Sanjuana Kava, NP  insulin aspart (novoLOG) injection 0-15 Units  0-15 Units Subcutaneous TID WC Onuoha, Chinwendu V, NP   8 Units at 11/01/22 1208   insulin aspart (novoLOG) injection 0-5 Units  0-5 Units Subcutaneous QHS Onuoha, Chinwendu V, NP   3 Units at 10/30/22 2130   insulin aspart (novoLOG) injection 3 Units  3 Units Subcutaneous TID WC Armandina Stammer I, NP   3 Units at 11/01/22 1209   insulin glargine-yfgn (SEMGLEE) injection 23 Units  23 Units Subcutaneous Q breakfast Armandina Stammer I, NP   23 Units at 11/01/22 0819   magnesium hydroxide (MILK OF MAGNESIA) suspension 30 mL  30 mL Oral Daily PRN Oneta Rack, NP       melatonin tablet 5 mg  5 mg Oral QHS Nwoko, Agnes I, NP   5 mg at 10/31/22 2117   ondansetron (ZOFRAN-ODT) disintegrating tablet 4 mg  4 mg Oral Q8H PRN Cecilie Lowers, FNP   4 mg at 10/27/22 0843   traZODone (DESYREL) tablet 50 mg  50 mg Oral QHS PRN Leevy-Johnson, Brooke A, NP   50 mg at 10/31/22 2118    Lab Results:  Results for orders placed or performed during the hospital encounter of 10/25/22 (from the past 48 hour(s))  Glucose, capillary      Status: Abnormal   Collection Time: 10/30/22  5:11 PM  Result Value Ref Range   Glucose-Capillary 199 (H) 70 - 99 mg/dL    Comment: Glucose reference range applies only to samples taken after fasting for at least 8 hours.  Glucose, capillary     Status: Abnormal   Collection Time: 10/30/22  8:56 PM  Result Value Ref Range   Glucose-Capillary 294 (H) 70 - 99 mg/dL    Comment: Glucose reference range applies only to samples taken after fasting for at least 8 hours.   Comment 1 Notify RN    Comment 2 Document in Chart   Glucose, capillary     Status: Abnormal   Collection Time: 10/31/22  5:55 AM  Result Value Ref Range   Glucose-Capillary 103 (H) 70 - 99 mg/dL    Comment: Glucose reference range applies only to samples taken after fasting for at least 8 hours.   Comment 1 Notify RN    Comment 2 Document in Chart   Glucose, capillary     Status: Abnormal   Collection Time: 10/31/22 12:05 PM  Result Value Ref Range   Glucose-Capillary 234 (H) 70 - 99 mg/dL    Comment: Glucose reference range applies only to samples taken after fasting for at least 8 hours.  Glucose, capillary     Status: Abnormal   Collection Time: 10/31/22  5:08 PM  Result Value Ref Range   Glucose-Capillary 164 (H) 70 - 99 mg/dL    Comment: Glucose reference range applies only to samples taken after fasting for at least 8 hours.  Glucose, capillary     Status: Abnormal   Collection Time: 10/31/22  9:03 PM  Result Value Ref Range   Glucose-Capillary 348 (H) 70 - 99 mg/dL    Comment: Glucose reference range applies only to samples taken after fasting for at least 8 hours.  Glucose, capillary     Status: Abnormal   Collection Time: 11/01/22  6:08 AM  Result Value Ref Range   Glucose-Capillary 235 (H) 70 - 99 mg/dL    Comment: Glucose reference range applies only to samples taken after fasting for at least 8 hours.   Comment 1 Notify  RN    Comment 2 Document in Chart   Glucose, capillary     Status: Abnormal    Collection Time: 11/01/22 11:58 AM  Result Value Ref Range   Glucose-Capillary 287 (H) 70 - 99 mg/dL    Comment: Glucose reference range applies only to samples taken after fasting for at least 8 hours.    Blood Alcohol level:  Lab Results  Component Value Date   ETH <10 10/23/2022   ETH <10 10/20/2019    Metabolic Disorder Labs: Lab Results  Component Value Date   HGBA1C 7.0 (H) 10/23/2022   MPG 154.2 10/23/2022   MPG 128 05/25/2021   Lab Results  Component Value Date   PROLACTIN 5.3 10/23/2022   Lab Results  Component Value Date   CHOL 188 10/23/2022   TRIG 66 10/23/2022   HDL 69 10/23/2022   CHOLHDL 2.7 10/23/2022   VLDL 13 10/23/2022   LDLCALC 106 (H) 10/23/2022    Physical Findings: AIMS: Facial and Oral Movements Muscles of Facial Expression: None, normal Lips and Perioral Area: None, normal Jaw: None, normal Tongue: None, normal,Extremity Movements Upper (arms, wrists, hands, fingers): None, normal Lower (legs, knees, ankles, toes): None, normal, Trunk Movements Neck, shoulders, hips: None, normal, Overall Severity Severity of abnormal movements (highest score from questions above): None, normal Incapacitation due to abnormal movements: None, normal Patient's awareness of abnormal movements (rate only patient's report): No Awareness, Dental Status Current problems with teeth and/or dentures?: No Does patient usually wear dentures?: No  CIWA:    COWS:     Musculoskeletal: Strength & Muscle Tone: within normal limits Gait & Station: normal Patient leans: N/A  Psychiatric Specialty Exam:  Presentation  General Appearance:  Disheveled  Eye Contact: Fair  Speech: Clear and Coherent  Speech Volume: Normal  Handedness: Right  Mood and Affect  Mood: Anxious; Depressed  Affect: Flat  Thought Process  Thought Processes: Coherent  Descriptions of Associations:Intact  Orientation:Full (Time, Place and Person)  Thought  Content:Logical  History of Schizophrenia/Schizoaffective disorder:No  Duration of Psychotic Symptoms:No data recorded Hallucinations:Hallucinations: None   Ideas of Reference:None  Suicidal Thoughts:Suicidal Thoughts: No SI Passive Intent and/or Plan: -- (Denies)   Homicidal Thoughts:Homicidal Thoughts: No   Sensorium  Memory: Immediate Good; Recent Good  Judgment: Fair  Insight: Shallow  Executive Functions  Concentration: Fair  Attention Span: Fair  Recall: Fair  Fund of Knowledge: Fair  Language: Fair  Psychomotor Activity  Psychomotor Activity: Psychomotor Activity: Normal   Assets  Assets: Communication Skills; Physical Health; Resilience  Sleep  Sleep: Sleep: Good Number of Hours of Sleep: 6   Physical Exam: Physical Exam Vitals and nursing note reviewed.  HENT:     Head: Normocephalic.     Nose: Nose normal.     Mouth/Throat:     Mouth: Mucous membranes are moist.     Pharynx: Oropharynx is clear.  Eyes:     Conjunctiva/sclera: Conjunctivae normal.     Pupils: Pupils are equal, round, and reactive to light.  Cardiovascular:     Rate and Rhythm: Normal rate.     Pulses: Normal pulses.  Pulmonary:     Effort: Pulmonary effort is normal.  Abdominal:     Comments: Deferred  Genitourinary:    Comments: Deferred Musculoskeletal:        General: Normal range of motion.     Cervical back: Normal range of motion.  Skin:    General: Skin is warm and dry.  Neurological:  General: No focal deficit present.     Mental Status: He is alert and oriented to person, place, and time.  Psychiatric:        Attention and Perception: He does not perceive auditory or visual hallucinations.        Mood and Affect: Mood is depressed. Affect is blunt and flat.        Behavior: Behavior is withdrawn.        Thought Content: Thought content is delusional.        Judgment: Judgment is impulsive and inappropriate.    Review of Systems   Constitutional:  Negative for fever.  HENT:  Negative for congestion and sore throat.   Eyes: Negative.   Respiratory:  Negative for cough, shortness of breath and wheezing.   Cardiovascular:  Negative for chest pain and palpitations.       History of diabetes type 1  Gastrointestinal: Negative.  Negative for abdominal pain, constipation, diarrhea, nausea and vomiting.       Hx of diabetes x 1  Genitourinary: Negative.   Musculoskeletal: Negative.   Skin: Negative.   Neurological:  Negative for dizziness, tingling, tremors, sensory change, speech change, focal weakness, seizures, loss of consciousness, weakness and headaches.  Endo/Heme/Allergies: Negative.        See allergy listing  Psychiatric/Behavioral:  Positive for depression. The patient is nervous/anxious and has insomnia.    Blood pressure 128/65, pulse 87, temperature 98.2 F (36.8 C), temperature source Oral, resp. rate 16, height 5\' 6"  (1.676 m), weight 59.9 kg, SpO2 100 %. Body mass index is 21.31 kg/m.                                                                                           Treatment Plan Summary: Daily contact with patient to assess and evaluate symptoms and progress in treatment and Medication management  Continue inpatient hospitalization.  Will continue today 11/01/2022 plan as below except where it is noted.    Physician Treatment Plan for Primary Diagnosis:  MDD major depressive disorder recurrent severe without psychosis DMDD disruptive mood dysregulation disorder Suicidal ideation   Plan: Medication:  Continue:  Trazodone 50 mg PO HS PRN insomnia Abilify 5 mg po qd augment antidepressant.  Melatonin 5 mg PO qHS  insomnia . Fluoxetine 40 mg po qd for depression   Agitation protocol: Benadryl 50 mg PO/IM, Haldol 5 mg PO/IM 3 times daily as needed agitation.   Medical issues.  Continue SEMGLEE 23 units SubQ daily with breakfast for DM.  Continue CBG/sliding insulin coverage as  recommended for DM.  Continue Novolog insulin 3 units tid with meals for DM    Other PRN Medications -Acetaminophen 650 mg every 6 as needed/mild pain -Maalox 30 mL oral every 4 as needed/digestion -Magnesium hydroxide 30 mL daily as needed/mild constipation --Hydroxyzine 25 mg p.o. 3 times daily as needed anxiety  Safety and Monitoring: Voluntary admission to inpatient psychiatric unit for safety, stabilization and treatment Daily contact with patient to assess and evaluate symptoms and progress in treatment Patient's case to be discussed in multi-disciplinary team meeting Observation Level : q15 minute checks Vital  signs: q12 hours Precautions: suicide, but pt currently verbally contracts for safety on unit    Discharge Planning: Social work and case management to assist with discharge planning and identification of hospital follow-up needs prior to discharge Estimated LOS: 5-7 days Discharge Concerns: Need to establish a safety plan; Medication compliance and effectiveness Discharge Goals: Return home with outpatient referrals for mental health follow-up including medication management/psychotherapy.    I certify that inpatient services furnished can reasonably be expected to improve the patient's condition.     Cecilie Lowers, FNP 11/01/2022, 4:33 PM  Patient ID: [name], male   DOB: 07-Jul-2000, 22 y.o.   MRN: [id number] Patient ID: BEHR SLAYTON, male   DOB: 17-Mar-2001, 21 y.o.   MRN: [id number]

## 2022-11-01 NOTE — Progress Notes (Signed)
D:  Patient's self inventory sheet, patient has fair sleep, sleep medication helpful.  Good appetite, normal energy level, good concentration.  Rated depression and hopeless 8.  Denied Anxiety.  Denied SI.  Denied physical problem.  Denied physical pain.  Goal is get rid of the chains wrapped around me dragging me down and the shadows that cloud my emotions.  Wants the medication to help him.  No discharge plans. A:  Medications administered per MD orders.  Emotional support and encouragement given patient. R  Denied SI and HI, contracts for safety.  Denied A/V hallucinations.  Safety maintained with 15 minute checks.

## 2022-11-01 NOTE — Group Note (Signed)
Recreation Therapy Group Note   Group Topic:Other  Group Date: 11/01/2022 Start Time: 0935 End Time: 1005 Facilitators: Sharryn Belding-McCall, LRT,CTRS Location: [address] Dayroom   Goal Area(s) Addresses:  Patient will work together to answer trivia questions.   Patient will be respectful of others throughout activity.  Group Description: Music Trivia.  Patients were partnered up compete in activity.  LRT read trivia questions that covered different styles, types and genres of music.  The team with the highest score wins the game.    Affect/Mood: N/A   Participation Level: Did not attend    Clinical Observations/Individualized Feedback:     Plan: Continue to engage patient in RT group sessions 2-3x/week.   Daeshawn Redmann-McCall, LRT,CTRS 11/01/2022 12:02 PM

## 2022-11-01 NOTE — [facility] Group Notes (Signed)
Spirituality Group facilitated by Kathleen Argue, B[facility]  Group focused on topic of strength. Group members reflected on what thoughts and feelings emerge when they hear this topic. They then engaged in facilitated dialog around how strength is present in their lives. This dialog focused on representing what strength had been to them in their lives (images and patterns given) and what they saw as helpful in their life now (what they needed / wanted).  Activity drew on narrative framework.  Patient Progress: Jeffrey Delgado attended group and actively engaged and participated in group conversation.  He shared about wanting to feel alive again and needing strength to be able to do so.

## 2022-11-01 NOTE — Progress Notes (Signed)
   11/01/22 0111  Psych Admission Type (Psych Patients Only)  Admission Status Voluntary  Psychosocial Assessment  Patient Complaints Depression;Anxiety  Eye Contact Brief  Facial Expression Flat  Affect Apprehensive;Anxious  Speech Logical/coherent;Soft  Interaction Minimal;Guarded  Motor Activity Other (Comment) (WDL)  Appearance/Hygiene Unremarkable  Behavior Characteristics Appropriate to situation  Mood Anxious;Depressed  Thought Process  Coherency WDL  Content WDL  Delusions None reported or observed  Perception WDL  Hallucination None reported or observed  Judgment Limited  Confusion None  Danger to Self  Current suicidal ideation? Denies  Self-Injurious Behavior No self-injurious ideation or behavior indicators observed or expressed   Agreement Not to Harm Self Yes  Description of Agreement verba;  Danger to Others  Danger to Others None reported or observed

## 2022-11-01 NOTE — BH IP Treatment Plan (Signed)
Interdisciplinary Treatment and Diagnostic Plan Update  11/01/2022 Time of Session: 9:50 AM ( UPDATE)  OLLICE KITTELL MRN: [id number]  Principal Diagnosis: MDD (major depressive disorder), recurrent episode, severe (HCC)  Secondary Diagnoses: Principal Problem:   MDD (major depressive disorder), recurrent episode, severe (HCC) Active Problems:   MDD (major depressive disorder)   Current Medications:  Current Facility-Administered Medications  Medication Dose Route Frequency Provider Last Rate Last Admin   acetaminophen (TYLENOL) tablet 650 mg  650 mg Oral Q6H PRN Oneta Rack, NP       alum & mag hydroxide-simeth (MAALOX/MYLANTA) 200-200-20 MG/5ML suspension 30 mL  30 mL Oral Q4H PRN Oneta Rack, NP       ARIPiprazole (ABILIFY) tablet 5 mg  5 mg Oral Daily Leevy-Johnson, Brooke A, NP   5 mg at 11/01/22 0809   diphenhydrAMINE (BENADRYL) capsule 50 mg  50 mg Oral Q8H PRN Ntuen, Jesusita Oka, FNP       Or   diphenhydrAMINE (BENADRYL) injection 50 mg  50 mg Intramuscular Q6H PRN Ntuen, Jesusita Oka, FNP       FLUoxetine (PROZAC) capsule 40 mg  40 mg Oral Daily Leevy-Johnson, Brooke A, NP   40 mg at 11/01/22 0809   haloperidol (HALDOL) tablet 5 mg  5 mg Oral Q8H PRN Ntuen, Jesusita Oka, FNP       Or   haloperidol lactate (HALDOL) injection 5 mg  5 mg Intramuscular Q8H PRN Ntuen, Jesusita Oka, FNP       hydrOXYzine (ATARAX) tablet 25 mg  25 mg Oral TID PRN Armandina Stammer I, NP       insulin aspart (novoLOG) injection 0-15 Units  0-15 Units Subcutaneous TID WC Onuoha, Chinwendu V, NP   8 Units at 11/01/22 1208   insulin aspart (novoLOG) injection 0-5 Units  0-5 Units Subcutaneous QHS Onuoha, Chinwendu V, NP   3 Units at 10/30/22 2130   insulin aspart (novoLOG) injection 3 Units  3 Units Subcutaneous TID WC Armandina Stammer I, NP   3 Units at 11/01/22 1209   insulin glargine-yfgn (SEMGLEE) injection 23 Units  23 Units Subcutaneous Q breakfast Armandina Stammer I, NP   23 Units at 11/01/22 0819   magnesium hydroxide  (MILK OF MAGNESIA) suspension 30 mL  30 mL Oral Daily PRN Oneta Rack, NP       melatonin tablet 5 mg  5 mg Oral QHS Nwoko, Agnes I, NP   5 mg at 10/31/22 2117   ondansetron (ZOFRAN-ODT) disintegrating tablet 4 mg  4 mg Oral Q8H PRN Cecilie Lowers, FNP   4 mg at 10/27/22 0843   traZODone (DESYREL) tablet 50 mg  50 mg Oral QHS PRN Leevy-Johnson, Brooke A, NP   50 mg at 10/31/22 2118   PTA Medications: Medications Prior to Admission  Medication Sig Dispense Refill Last Dose   Continuous Glucose Sensor (DEXCOM G7 SENSOR) MISC 1 each by Does not apply route daily as needed. Continous Glucose Meter      insulin aspart (NOVOLOG) 100 UNIT/ML injection Inject 45 Units into the skin daily. Patient uses up to 45 units per day based on carb intake and BG levels      insulin glargine (LANTUS) 100 UNIT/ML injection Inject 20 Units into the skin daily.       Patient Stressors: Health problems   Marital or family conflict   Traumatic event    Patient Strengths: Ability for insight  Average or above average intelligence  Communication skills  Treatment Modalities: Medication Management, Group therapy, Case management,  1 to 1 session with clinician, Psychoeducation, Recreational therapy.   Physician Treatment Plan for Primary Diagnosis: MDD (major depressive disorder), recurrent episode, severe (HCC) Long Term Goal(s): Improvement in symptoms so as ready for discharge   Short Term Goals: Ability to identify changes in lifestyle to reduce recurrence of condition will improve Ability to verbalize feelings will improve Ability to disclose and discuss suicidal ideas Ability to demonstrate self-control will improve Ability to identify and develop effective coping behaviors will improve Ability to maintain clinical measurements within normal limits will improve Compliance with prescribed medications will improve Ability to identify triggers associated with substance abuse/mental health issues will  improve  Medication Management: Evaluate patient's response, side effects, and tolerance of medication regimen.  Therapeutic Interventions: 1 to 1 sessions, Unit Group sessions and Medication administration.  Evaluation of Outcomes: Progressing  Physician Treatment Plan for Secondary Diagnosis: Principal Problem:   MDD (major depressive disorder), recurrent episode, severe (HCC) Active Problems:   MDD (major depressive disorder)  Long Term Goal(s): Improvement in symptoms so as ready for discharge   Short Term Goals: Ability to identify changes in lifestyle to reduce recurrence of condition will improve Ability to verbalize feelings will improve Ability to disclose and discuss suicidal ideas Ability to demonstrate self-control will improve Ability to identify and develop effective coping behaviors will improve Ability to maintain clinical measurements within normal limits will improve Compliance with prescribed medications will improve Ability to identify triggers associated with substance abuse/mental health issues will improve     Medication Management: Evaluate patient's response, side effects, and tolerance of medication regimen.  Therapeutic Interventions: 1 to 1 sessions, Unit Group sessions and Medication administration.  Evaluation of Outcomes: Progressing   RN Treatment Plan for Primary Diagnosis: MDD (major depressive disorder), recurrent episode, severe (HCC) Long Term Goal(s): Knowledge of disease and therapeutic regimen to maintain health will improve  Short Term Goals: Ability to remain free from injury will improve, Ability to verbalize frustration and anger appropriately will improve, Ability to participate in decision making will improve, Ability to verbalize feelings will improve, Ability to identify and develop effective coping behaviors will improve, and Compliance with prescribed medications will improve  Medication Management: RN will administer medications  as ordered by provider, will assess and evaluate patient's response and provide education to patient for prescribed medication. RN will report any adverse and/or side effects to prescribing provider.  Therapeutic Interventions: 1 on 1 counseling sessions, Psychoeducation, Medication administration, Evaluate responses to treatment, Monitor vital signs and CBGs as ordered, Perform/monitor CIWA, COWS, AIMS and Fall Risk screenings as ordered, Perform wound care treatments as ordered.  Evaluation of Outcomes: Progressing   LCSW Treatment Plan for Primary Diagnosis: MDD (major depressive disorder), recurrent episode, severe (HCC) Long Term Goal(s): Safe transition to appropriate next level of care at discharge, Engage patient in therapeutic group addressing interpersonal concerns.  Short Term Goals: Engage patient in aftercare planning with referrals and resources, Increase social support, Increase emotional regulation, Facilitate acceptance of mental health diagnosis and concerns, Identify triggers associated with mental health/substance abuse issues, and Increase skills for wellness and recovery  Therapeutic Interventions: Assess for all discharge needs, 1 to 1 time with Social worker, Explore available resources and support systems, Assess for adequacy in community support network, Educate family and significant other(s) on suicide prevention, Complete Psychosocial Assessment, Interpersonal group therapy.  Evaluation of Outcomes: Progressing  Progress in Treatment: Attending groups: Yes. Participating in groups: Yes. Taking  medication as prescribed: Yes. Toleration medication: Yes. Family/Significant other contact made: Yes, individual(s) contacted:  (mom) Carroll Sage [phone] Patient understands diagnosis: Yes. Discussing patient identified problems/goals with staff: Yes. Medical problems stabilized or resolved: Yes. Denies suicidal/homicidal ideation: Yes. Issues/concerns per  patient self-inventory: No.     New problem(s) identified: No, Describe:  None reported    New Short Term/Long Term Goal(s):medication stabilization, elimination of SI thoughts, development of comprehensive mental wellness plan.     Patient Goals:  " get on medications for my depression and feel emotions again "    Discharge Plan or Barriers: Patient recently admitted. CSW will continue to follow and assess for appropriate referrals and possible discharge planning.     Reason for Continuation of Hospitalization: Anxiety Depression Medication stabilization Suicidal ideation   Estimated Length of Stay: 2-4 days      Last 3 [place] Suicide Severity Risk Score: Flowsheet Row Ad[facility] (Current) [facility] from 10/23/2022 in [facility] Office Visit from 02/10/2022 in [facility]  C-SSRS RISK CATEGORY No Risk No Risk No Risk       Last PHQ 2/9 Scores:    02/10/2022   10:18 AM 11/19/2021    3:04 PM 08/26/2020    3:14 PM  Depression screen PHQ 2/9  Decreased Interest 2 0 1  Down, Depressed, Hopeless 2 3 1   PHQ - 2 Score 4 3 2   Altered sleeping 0 0 1  Tired, decreased energy 1 0 1  Change in appetite 0 0 1  Feeling bad or failure about yourself  3 3 1   Trouble concentrating 2 0 1  Moving slowly or fidgety/restless 0 0 1  Suicidal thoughts 0 0 0  PHQ-9 Score 10 6 8   Difficult doing work/chores Somewhat difficult Very difficult Somewhat difficult    Scribe for Treatment Team: Isabella Bowens, LCSWA 11/01/2022 1:01 PM

## 2022-11-01 NOTE — Group Note (Signed)
Date:  11/01/2022 Time:  11:50 AM  Group Topic/Focus:  Goals Group:   The focus of this group is to help patients establish daily goals to achieve during treatment and discuss how the patient can incorporate goal setting into their daily lives to aide in recovery.    Participation Level:  Active  Participation Quality:  Appropriate  Affect:  Appropriate  Cognitive:  Alert  Insight: Appropriate  Engagement in Group:  Engaged  Modes of Intervention:  Clarification and Discussion  Additional Comments:    Beckie Busing 11/01/2022, 11:50 AM

## 2022-11-01 NOTE — Progress Notes (Signed)
The patient attended the evening A.A. speaker's meeting and was appropriate.

## 2022-11-02 LAB — GLUCOSE, CAPILLARY
Glucose-Capillary: 189 mg/dL — ABNORMAL HIGH (ref 70–99)
Glucose-Capillary: 265 mg/dL — ABNORMAL HIGH (ref 70–99)
Glucose-Capillary: 247 mg/dL — ABNORMAL HIGH (ref 70–99)
Glucose-Capillary: 256 mg/dL — ABNORMAL HIGH (ref 70–99)

## 2022-11-02 NOTE — Progress Notes (Signed)
[facility] MD Progress Note  11/02/2022 4:01 PM DEMICO Delgado  MRN:  [id number]  Principal Problem: MDD (major depressive disorder), recurrent episode, severe (HCC)  Diagnosis: Principal Problem:   MDD (major depressive disorder), recurrent episode, severe (HCC) Active Problems:   MDD (major depressive disorder)  Reason for admission:  Jeffrey Delgado is a 22 year old male with prior history of psychiatric diagnoses significant for major depressive disorder, ADHD, history of autism spectrum, who presented voluntarily to [facility] for worsening depression with recurrent suicidal thought with plan to self-harm and end his life and not wake up in the context of break-up with girlfriend.   24-hour chart Review: Past 24 hours of patient's chart was reviewed.  Patient is compliant with scheduled meds. Required Agitation PRNs: Tylenol 650 mg p.o. given at 1412 last night for mild pain Per RN notes, no documented behavioral issues and is attending group. Patient slept, 6 hours  Today's assessment notes:  Patient seen and examined in his room sitting up on his bed on [address].  Patient is visible on the unit, observed interacting with other patients in the dayroom.  He is casually dressed; improved attention to ADLs noted today. Hair is neatly braided on the side. He continues to wear a mask over his face that he says is to 'hide his emotions', however, able to remove mask for assessment during the examination per provide's request.  Attention to hygiene is fair, and seen with change to regular T-shirt and a pair of pant today. Fair eye contact with clear speech.  Mood is less depressed,  with improved flat  affect . He remains concrete in thinking with less repetitive speech.  He continues to endorse negative thoughts about himself and hopeless outlook on his life. He rates his depression 7/10 on a scale of 0-10 with 10 being the worse, which is improved from yesterday assessment.  Patient added that his  depression is rated 7 due to his continuous thinking of his ex girlfriend and plans to give a call after discharge from [facility].  Jeffrey Delgado denies symptoms of anxiety.  He denies any SI/HI/AVH today. He remains medication compliant and denies any side effects at this time. Care reviewed with attending psychiatrist.  He continues on fluoxetine 40 mg for depressive symptoms; Abilify 5 mg  for continued depression and flattening affect, and Trazodone 50 mg PRN for insomnia. Will continue to monitor patient's response.  Social work remains involved with managing appointments for post discharge care.  Expected date of discharge tomorrow 11/03/2022.  Total Time spent with patient:  35 minutes.  Past Psychiatric History: Previous Psych Diagnoses: Yes, MDD, anxiety, ADHD, autism spectrum, developmental delay, DMDD, receptive expressive disorder, and SI Prior inpatient treatment: Yes at [facility], at age 31 to 22 years old due to assaulting police officers and destruction of property. Current/prior outpatient treatment: Patient seeing a counselor currently.   Prior rehab hx: Patient denies Psychotherapy hx: Yes History of suicide: Yes x 1 in the past History of homicide or aggression: Has history of genocide thoughts Psychiatric medication history: Yes, fluoxetine and Trileptal Psychiatric medication compliance history: None compliance Neuromodulation history: Denies  Current Psychiatrist: Denies having a psychiatrist Current therapist: Currently seeing a counselor   Past Medical History:  Past Medical History:  Diagnosis Date   ADHD (attention deficit hyperactivity disorder)    Autism spectrum disorder    Developmental delay    Diabetes mellitus without complication (HCC)    Type I   Dysgraphia    History  of tympanoplasty of right ear    [facility] 2015   Receptive-expressive language delay     Past Surgical History:  Procedure Laterality Date   ingrown toenails Bilateral    TONSILLECTOMY AND ADENOIDECTOMY      tubes in ear     and another ear surgery to fix the hole where the tubes fell out   Family History:  Family History  Problem Relation Age of Onset   ADD / ADHD Mother    Crohn's disease Mother    Schizophrenia Father    Family Psychiatric  History: Psych: Has history of schizophrenia, mom suffering from paranoia Psych Rx: Yes SA/HA: Dad Substance use family hx: Grandmother smokes cigarettes, and she is alcoholic  Social History:  Social History   Substance and Sexual Activity  Alcohol Use No     Social History   Substance and Sexual Activity  Drug Use No    Social History   Socioeconomic History   Marital status: Single    Spouse name: Not on file   Number of children: Not on file   Years of education: Not on file   Highest education level: Not on file  Occupational History   Not on file  Tobacco Use   Smoking status: Never   Smokeless tobacco: Never  Vaping Use   Vaping Use: Never used  Substance and Sexual Activity   Alcohol use: No   Drug use: No   Sexual activity: Never  Other Topics Concern   Not on file  Social History Narrative   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  Participates in taekwondo   Social Determinants of Health   Financial Resource Strain: Not on file  Food Insecurity: Patient Declined (10/25/2022)   Hunger Vital Sign    Worried About Running Out of Food in the Last Year: Patient declined    Ran Out of Food in the Last Year: Patient declined  Transportation Needs: Patient Declined (10/25/2022)   PRAPARE - Administrator, Civil Service (Medical): Patient declined    Lack of Transportation (Non-Medical): Patient declined  Physical Activity: Not on file  Stress: Not on file  Social Connections: Not on file   Additional Social History:    Sleep: Good  Appetite:  Good  Current Medications: Current Facility-Administered Medications  Medication Dose Route Frequency Provider Last Rate Last Admin   acetaminophen (TYLENOL) tablet  650 mg  650 mg Oral Q6H PRN Oneta Rack, NP   650 mg at 11/01/22 1412   alum & mag hydroxide-simeth (MAALOX/MYLANTA) 200-200-20 MG/5ML suspension 30 mL  30 mL Oral Q4H PRN Oneta Rack, NP       ARIPiprazole (ABILIFY) tablet 5 mg  5 mg Oral Daily Leevy-Johnson, Brooke A, NP   5 mg at 11/02/22 0806   diphenhydrAMINE (BENADRYL) capsule 50 mg  50 mg Oral Q8H PRN Mihail Prettyman, Jesusita Oka, FNP       Or   diphenhydrAMINE (BENADRYL) injection 50 mg  50 mg Intramuscular Q6H PRN Fallon Haecker, Jesusita Oka, FNP       FLUoxetine (PROZAC) capsule 40 mg  40 mg Oral Daily Leevy-Johnson, Brooke A, NP   40 mg at 11/02/22 0806   haloperidol (HALDOL) tablet 5 mg  5 mg Oral Q8H PRN Kemonie Cutillo, Jesusita Oka, FNP       Or   haloperidol lactate (HALDOL) injection 5 mg  5 mg Intramuscular Q8H PRN Chalee Hirota C, FNP       hydrOXYzine (ATARAX) tablet 25 mg  25  mg Oral TID PRN Armandina Stammer I, NP       insulin aspart (novoLOG) injection 0-15 Units  0-15 Units Subcutaneous TID WC Onuoha, Chinwendu V, NP   8 Units at 11/02/22 1159   insulin aspart (novoLOG) injection 0-5 Units  0-5 Units Subcutaneous QHS Onuoha, Chinwendu V, NP   2 Units at 11/01/22 2142   insulin aspart (novoLOG) injection 3 Units  3 Units Subcutaneous TID WC Nwoko, Nicole Kindred I, NP   3 Units at 11/02/22 1200   insulin glargine-yfgn (SEMGLEE) injection 23 Units  23 Units Subcutaneous Q breakfast Armandina Stammer I, NP   23 Units at 11/02/22 0810   magnesium hydroxide (MILK OF MAGNESIA) suspension 30 mL  30 mL Oral Daily PRN Oneta Rack, NP       melatonin tablet 5 mg  5 mg Oral QHS Nwoko, Agnes I, NP   5 mg at 11/01/22 2144   ondansetron (ZOFRAN-ODT) disintegrating tablet 4 mg  4 mg Oral Q8H PRN Cecilie Lowers, FNP   4 mg at 10/27/22 0843   traZODone (DESYREL) tablet 50 mg  50 mg Oral QHS PRN Leevy-Johnson, Brooke A, NP   50 mg at 10/31/22 2118    Lab Results:  Results for orders placed or performed during the hospital encounter of 10/25/22 (from the past 48 hour(s))  Glucose, capillary      Status: Abnormal   Collection Time: 10/31/22  5:08 PM  Result Value Ref Range   Glucose-Capillary 164 (H) 70 - 99 mg/dL    Comment: Glucose reference range applies only to samples taken after fasting for at least 8 hours.  Glucose, capillary     Status: Abnormal   Collection Time: 10/31/22  9:03 PM  Result Value Ref Range   Glucose-Capillary 348 (H) 70 - 99 mg/dL    Comment: Glucose reference range applies only to samples taken after fasting for at least 8 hours.  Glucose, capillary     Status: Abnormal   Collection Time: 11/01/22  6:08 AM  Result Value Ref Range   Glucose-Capillary 235 (H) 70 - 99 mg/dL    Comment: Glucose reference range applies only to samples taken after fasting for at least 8 hours.   Comment 1 Notify RN    Comment 2 Document in Chart   Glucose, capillary     Status: Abnormal   Collection Time: 11/01/22 11:58 AM  Result Value Ref Range   Glucose-Capillary 287 (H) 70 - 99 mg/dL    Comment: Glucose reference range applies only to samples taken after fasting for at least 8 hours.  Glucose, capillary     Status: Abnormal   Collection Time: 11/01/22  5:18 PM  Result Value Ref Range   Glucose-Capillary 286 (H) 70 - 99 mg/dL    Comment: Glucose reference range applies only to samples taken after fasting for at least 8 hours.   Comment 1 Notify RN   Glucose, capillary     Status: Abnormal   Collection Time: 11/01/22  8:56 PM  Result Value Ref Range   Glucose-Capillary 206 (H) 70 - 99 mg/dL    Comment: Glucose reference range applies only to samples taken after fasting for at least 8 hours.   Comment 1 Notify RN    Comment 2 Document in Chart   Glucose, capillary     Status: Abnormal   Collection Time: 11/02/22  5:54 AM  Result Value Ref Range   Glucose-Capillary 189 (H) 70 - 99 mg/dL  Comment: Glucose reference range applies only to samples taken after fasting for at least 8 hours.   Comment 1 Notify RN    Comment 2 Document in Chart   Glucose, capillary      Status: Abnormal   Collection Time: 11/02/22 11:40 AM  Result Value Ref Range   Glucose-Capillary 265 (H) 70 - 99 mg/dL    Comment: Glucose reference range applies only to samples taken after fasting for at least 8 hours.    Blood Alcohol level:  Lab Results  Component Value Date   ETH <10 10/23/2022   ETH <10 10/20/2019    Metabolic Disorder Labs: Lab Results  Component Value Date   HGBA1C 7.0 (H) 10/23/2022   MPG 154.2 10/23/2022   MPG 128 05/25/2021   Lab Results  Component Value Date   PROLACTIN 5.3 10/23/2022   Lab Results  Component Value Date   CHOL 188 10/23/2022   TRIG 66 10/23/2022   HDL 69 10/23/2022   CHOLHDL 2.7 10/23/2022   VLDL 13 10/23/2022   LDLCALC 106 (H) 10/23/2022    Physical Findings: AIMS: Facial and Oral Movements Muscles of Facial Expression: None, normal Lips and Perioral Area: None, normal Jaw: None, normal Tongue: None, normal,Extremity Movements Upper (arms, wrists, hands, fingers): None, normal Lower (legs, knees, ankles, toes): None, normal, Trunk Movements Neck, shoulders, hips: None, normal, Overall Severity Severity of abnormal movements (highest score from questions above): None, normal Incapacitation due to abnormal movements: None, normal Patient's awareness of abnormal movements (rate only patient's report): No Awareness, Dental Status Current problems with teeth and/or dentures?: No Does patient usually wear dentures?: No  CIWA:    COWS:     Musculoskeletal: Strength & Muscle Tone: within normal limits Gait & Station: normal Patient leans: N/A  Psychiatric Specialty Exam:  Presentation  General Appearance:  Casual  Eye Contact: Good  Speech: Clear and Coherent; Normal Rate  Speech Volume: Normal  Handedness: Right  Mood and Affect  Mood: Depressed  Affect: Flat  Thought Process  Thought Processes: Coherent; Linear  Descriptions of Associations:Intact  Orientation:Full (Time, Place and  Person)  Thought Content:Illogical  History of Schizophrenia/Schizoaffective disorder:No  Duration of Psychotic Symptoms:No data recorded Hallucinations:Hallucinations: None   Ideas of Reference:None  Suicidal Thoughts:Suicidal Thoughts: No SI Passive Intent and/or Plan: -- (Denies)   Homicidal Thoughts:Homicidal Thoughts: No   Sensorium  Memory: Immediate Good; Recent Good  Judgment: Fair  Insight: Present  Executive Functions  Concentration: Fair  Attention Span: Fair  Recall: Fair  Fund of Knowledge: Fair  Language: Good  Psychomotor Activity  Psychomotor Activity: Psychomotor Activity: Normal  Assets  Assets: Communication Skills; Housing; Physical Health; Resilience; Social Support  Sleep  Sleep: Sleep: Good Number of Hours of Sleep: 6  Physical Exam: Physical Exam Vitals and nursing note reviewed.  HENT:     Head: Normocephalic.     Nose: Nose normal.     Mouth/Throat:     Mouth: Mucous membranes are moist.     Pharynx: Oropharynx is clear.  Eyes:     Conjunctiva/sclera: Conjunctivae normal.     Pupils: Pupils are equal, round, and reactive to light.  Cardiovascular:     Rate and Rhythm: Normal rate.     Pulses: Normal pulses.  Pulmonary:     Effort: Pulmonary effort is normal.  Abdominal:     Comments: Deferred  Genitourinary:    Comments: Deferred Musculoskeletal:        General: Normal range of motion.  Cervical back: Normal range of motion.  Skin:    General: Skin is warm and dry.  Neurological:     General: No focal deficit present.     Mental Status: He is alert and oriented to person, place, and time.  Psychiatric:        Attention and Perception: He does not perceive auditory or visual hallucinations.        Mood and Affect: Mood is depressed. Affect is blunt and flat.        Behavior: Behavior is withdrawn.        Thought Content: Thought content is delusional.        Judgment: Judgment is impulsive and  inappropriate.    Review of Systems  Constitutional:  Negative for fever.  HENT:  Negative for congestion and sore throat.   Eyes: Negative.   Respiratory:  Negative for cough, shortness of breath and wheezing.   Cardiovascular:  Negative for chest pain and palpitations.       History of diabetes type 1  Gastrointestinal: Negative.  Negative for abdominal pain, constipation, diarrhea, nausea and vomiting.       Hx of diabetes x 1  Genitourinary: Negative.   Musculoskeletal: Negative.   Skin: Negative.   Neurological:  Negative for dizziness, tingling, tremors, sensory change, speech change, focal weakness, seizures, loss of consciousness, weakness and headaches.  Endo/Heme/Allergies: Negative.        See allergy listing  Psychiatric/Behavioral:  Positive for depression. The patient is nervous/anxious and has insomnia.    Blood pressure 128/81, pulse 91, temperature 98 F (36.7 C), temperature source Oral, resp. rate 16, height 5\' 6"  (1.676 m), weight 59.9 kg, SpO2 100 %. Body mass index is 21.31 kg/m.                                                                                           Treatment Plan Summary: Daily contact with patient to assess and evaluate symptoms and progress in treatment and Medication management  Continue inpatient hospitalization.  Will continue today 11/02/2022 plan as below except where it is noted.    Physician Treatment Plan for Primary Diagnosis:  MDD major depressive disorder recurrent severe without psychosis DMDD disruptive mood dysregulation disorder Suicidal ideation   Plan: Medication:  Continue:  Trazodone 50 mg PO HS PRN insomnia Abilify 5 mg po qd augment antidepressant.  Melatonin 5 mg PO qHS  insomnia . Fluoxetine 40 mg po qd for depression   Agitation protocol: Benadryl 50 mg PO/IM, Haldol 5 mg PO/IM 3 times daily as needed agitation.   Medical issues.  Continue SEMGLEE 23 units SubQ daily with breakfast for DM.  Continue  CBG/sliding insulin coverage as recommended for DM.  Continue Novolog insulin 3 units tid with meals for DM    Other PRN Medications -Acetaminophen 650 mg every 6 as needed/mild pain -Maalox 30 mL oral every 4 as needed/digestion -Magnesium hydroxide 30 mL daily as needed/mild constipation --Hydroxyzine 25 mg p.o. 3 times daily as needed anxiety  Safety and Monitoring: Voluntary admission to inpatient psychiatric unit for safety, stabilization and treatment Daily contact with patient to assess  and evaluate symptoms and progress in treatment Patient's case to be discussed in multi-disciplinary team meeting Observation Level : q15 minute checks Vital signs: q12 hours Precautions: suicide, but pt currently verbally contracts for safety on unit    Discharge Planning: Social work and case management to assist with discharge planning and identification of hospital follow-up needs prior to discharge Estimated LOS: 5-7 days Discharge Concerns: Need to establish a safety plan; Medication compliance and effectiveness Discharge Goals: Return home with outpatient referrals for mental health follow-up including medication management/psychotherapy.    I certify that inpatient services furnished can reasonably be expected to improve the patient's condition.     Cecilie Lowers, FNP 11/02/2022, 4:01 PM  Patient ID: Jeffrey Delgado, male   DOB: 04-27-2001, 22 y.o.   MRN: [id number] Patient ID: Jeffrey Delgado, male   DOB: 10-12-2000, 21 y.o.   MRN: [id number] Patient ID: Jeffrey Delgado, male   DOB: 12-Sep-2000, 21 y.o.   MRN: [id number]

## 2022-11-02 NOTE — Group Note (Signed)
Date:  11/02/2022 Time:  3:16 PM  Group Topic/Focus:  Coping With Mental Health Crisis:   The purpose of this group is to help patients identify strategies for coping with mental health crisis.  Group discusses possible causes of crisis and ways to manage them effectively. Emotional Education:   The focus of this group is to discuss what feelings/emotions are, and how they are experienced.    Participation Level:  Active  Participation Quality:  Appropriate  Affect:  Appropriate  Cognitive:  Appropriate  Insight: Appropriate  Engagement in Group:  Engaged  Modes of Intervention:  Discussion, Rapport Building, and Support  Additional Comments:    Memory Dance Rhena Glace 11/02/2022, 3:16 PM

## 2022-11-02 NOTE — Group Note (Signed)
Date:  11/02/2022 Time:  4:41 PM  Group Topic/Focus:  Goals Group:   The focus of this group is to help patients establish daily goals to achieve during treatment and discuss how the patient can incorporate goal setting into their daily lives to aide in recovery. Orientation:   The focus of this group is to educate the patient on the purpose and policies of crisis stabilization and provide a format to answer questions about their admission.  The group details unit policies and expectations of patients while admitted.    Participation Level:  Active  Participation Quality:  Appropriate  Affect:  Appropriate  Cognitive:  Appropriate  Insight: Appropriate  Engagement in Group:  Engaged  Modes of Intervention:  Discussion, Orientation, and Support  Additional Comments:   Pt attended and participated in the Orientation/Goals group.  Jeffrey Delgado 11/02/2022, 4:41 PM

## 2022-11-02 NOTE — Progress Notes (Signed)
D: Patient is alert, oriented, and cooperative. Denies SI, HI, AVH, and verbally contracts for safety. Patient reports he slept fair last night with sleeping medication. Patient reports his appetite as good, energy level as normal, and concentration as good. Patient rates his depression 7/10, hopelessness 8/10, and anxiety 0/10. Patient denies physical symptoms/pain.    A: Scheduled medications administered per MD order. Support provided. Patient educated on safety on the unit and medications. Routine safety checks every 15 minutes. Patient stated understanding to tell nurse about any new physical symptoms. Patient understands to tell staff of any needs.     R: No adverse drug reactions noted. Patient verbally contracts for safety. Patient remains safe at this time and will continue to monitor.    11/02/22 1100  Psych Admission Type (Psych Patients Only)  Admission Status Voluntary  Psychosocial Assessment  Patient Complaints Depression  Eye Contact Fair  Facial Expression Flat  Affect Apprehensive;Anxious  Speech Logical/coherent  Interaction Minimal  Motor Activity Other (Comment) (WNL)  Appearance/Hygiene Unremarkable  Behavior Characteristics Cooperative;Appropriate to situation  Mood Depressed  Thought Process  Coherency WDL  Content WDL  Delusions None reported or observed  Perception WDL  Hallucination None reported or observed  Judgment Limited  Confusion None  Danger to Self  Current suicidal ideation? Denies  Agreement Not to Harm Self Yes  Description of Agreement verbal  Danger to Others  Danger to Others None reported or observed

## 2022-11-02 NOTE — Progress Notes (Signed)
   11/02/22 0600  15 Minute Checks  Location Bedroom  Visual Appearance Calm  Behavior Sleeping  Sleep (Behavioral Health Patients Only)  Calculate sleep? (Click Yes once per 24 hr at 0600 safety check) Yes  Documented sleep last 24 hours 6.25

## 2022-11-02 NOTE — Group Note (Signed)
Date:  11/02/2022 Time:  5:08 PM  Group Topic/Focus:  MHA Group/Peer Support/WRAP    Participation Level:  Active  Participation Quality:  Appropriate  Affect:  Appropriate  Cognitive:  Appropriate  Insight: Appropriate  Engagement in Group:  Engaged  Modes of Intervention:  Education and Support  Additional Comments:   Pt attended the [facility] therapeutic group.  Edmund Hilda Kerah Hardebeck 11/02/2022, 5:08 PM

## 2022-11-02 NOTE — Inpatient Diabetes Management (Signed)
Inpatient Diabetes Program Recommendations  AACE/ADA: New Consensus Statement on Inpatient Glycemic Control (2015)  Target Ranges:  Prepandial:   less than 140 mg/dL      Peak postprandial:   less than 180 mg/dL (1-2 hours)      Critically ill patients:  140 - 180 mg/dL   Lab Results  Component Value Date   GLUCAP 265 (H) 11/02/2022   HGBA1C 7.0 (H) 10/23/2022    Review of Glycemic Control  Latest Reference Range & Units 11/01/22 06:08 11/01/22 11:58 11/01/22 17:18 11/01/22 20:56 11/02/22 05:54 11/02/22 11:40  Glucose-Capillary 70 - 99 mg/dL 161 (H) 096 (H) 045 (H) 206 (H) 189 (H) 265 (H)  Diabetes history: DM1(does not make insulin.  Needs correction, basal and meal coverage) Outpatient Diabetes medications: Lantus 20 units QD, Novolog correction and carb coverage, Dexcom G7 Current orders for Inpatient glycemic control:   Semglee 23 units QAM, Novolog 0-15 units TID and 0-5 units QHS  Novolog 3 units tid with meals  Inpatient Diabetes Program Recommendations:    Consider increasing Novolog meal coverage to 5 units tid with meals (Hold if patient does not eat more than 50% of meal).   Thanks,  Beryl Meager, RN, BC-ADM Inpatient Diabetes Coordinator Pager [phone]  (8a-5p)

## 2022-11-02 NOTE — Group Note (Signed)
Date:  11/02/2022 Time:  4:58 PM  Group Topic/Focus:  MHA Group/Peer Support/WRAP    Participation Level:  Active  Participation Quality:  Appropriate  Affect:  Appropriate  Cognitive:  Appropriate  Insight: Appropriate  Engagement in Group:  Engaged  Modes of Intervention:  Education and Support  Additional Comments:   Pt attended the [facility] therapeutic group.  Jeffrey Delgado 11/02/2022, 4:58 PM

## 2022-11-02 NOTE — B[facility] Group Notes (Signed)
Adult Psychoeducational Group Note  Date:  11/02/2022 Time:  10:38 PM  Group Topic/Focus:  Wrap-Up Group:   The focus of this group is to help patients review their daily goal of treatment and discuss progress on daily workbooks.  Participation Level:  Active  Participation Quality:  Appropriate  Affect:  Appropriate  Cognitive:  Appropriate  Insight: Appropriate  Engagement in Group:  Engaged  Modes of Intervention:  Discussion  Additional Comments:  Pt participated in wrap-up group.  Desia Saban S Marilyn Wing 11/02/2022, 10:38 PM 

## 2022-11-02 NOTE — Group Note (Signed)
Date:  11/02/2022 Time:  5:22 PM  Group Topic/Focus:  Dimensions of Wellness:   The focus of this group is to introduce the topic of wellness and discuss the role each dimension of wellness plays in total health.    Participation Level:  Did Not Attend  Participation Quality:   n/a  Affect:   n/a  Cognitive:   n/a  Insight: None  Engagement in Group:   n/a  Modes of Intervention:   n/a  Additional Comments:   Pt did not attend.  Edmund Hilda Seriah Brotzman 11/02/2022, 5:22 PM

## 2022-11-02 NOTE — Group Note (Signed)
Recreation Therapy Group Note   Group Topic:Animal Assisted Therapy   Group Date: 11/02/2022 Start Time: 0945 End Time: 1033 Facilitators: Elwin Tsou-McCall, LRT,CTRS Location: [address] Dayroom   Animal-Assisted Activity (AAA) Program Checklist/Progress Notes Patient Eligibility Criteria Checklist & Daily Group note for Rec Tx Intervention  AAA/T Program Assumption of Risk Form signed by Patient/ or Parent Legal Guardian Yes  Patient is free of allergies or severe asthma Yes  Patient reports no fear of animals Yes  Patient reports no history of cruelty to animals Yes  Patient understands his/her participation is voluntary Yes  Patient washes hands before animal contact Yes  Patient washes hands after animal contact Yes   Affect/Mood: Appropriate   Participation Level: Engaged   Participation Quality: Independent   Behavior: Appropriate   Speech/Thought Process: Focused   Insight: Good   Judgement: Good   Modes of Intervention: Teaching laboratory technician   Patient Response to Interventions:  Engaged   Education Outcome:  Acknowledges education   Clinical Observations/Individualized Feedback:  Patient attended session and interacted appropriately with therapy dog and peers. Patient asked appropriate questions about therapy dog and his training. Patient shared stories about their pets at home with group.    Plan: Continue to engage patient in RT group sessions 2-3x/week.   Leita Lindbloom-McCall, LRT,CTRS 11/02/2022 11:58 AM

## 2022-11-03 LAB — GLUCOSE, CAPILLARY: Glucose-Capillary: 243 mg/dL — ABNORMAL HIGH (ref 70–99)

## 2022-11-03 MED ORDER — INSULIN GLARGINE-YFGN 100 UNIT/ML ~~LOC~~ SOLN: 23.0000 [IU] | Freq: Every day | SUBCUTANEOUS | 11 refills | Status: DC

## 2022-11-03 MED ORDER — TRAZODONE HCL 50 MG PO TABS: 50.0000 mg | ORAL_TABLET | Freq: Every evening | ORAL | 0 refills | Status: DC | PRN

## 2022-11-03 MED ORDER — INSULIN ASPART 100 UNIT/ML IJ SOLN: 0.0000 [IU] | Freq: Three times a day (TID) | INTRAMUSCULAR | 11 refills | Status: DC

## 2022-11-03 MED ORDER — HYDROXYZINE HCL 25 MG PO TABS: 25.0000 mg | ORAL_TABLET | Freq: Three times a day (TID) | ORAL | 0 refills | Status: DC | PRN

## 2022-11-03 MED ORDER — FLUOXETINE HCL 40 MG PO CAPS: 40.0000 mg | ORAL_CAPSULE | Freq: Every day | ORAL | 3 refills | Status: DC

## 2022-11-03 MED ORDER — ARIPIPRAZOLE 5 MG PO TABS: 5.0000 mg | ORAL_TABLET | Freq: Every day | ORAL | 0 refills | Status: DC

## 2022-11-03 NOTE — Plan of Care (Signed)
  Problem: Coping: Goal: Ability to adjust to condition or change in health will improve Outcome: Progressing   Problem: Nutritional: Goal: Maintenance of adequate nutrition will improve Outcome: Progressing Goal: Progress toward achieving an optimal weight will improve Outcome: Progressing   Problem: Tissue Perfusion: Goal: Adequacy of tissue perfusion will improve Outcome: Progressing  Patient compliant with medications denies SI/HI/A/VH and verbally contracts for safety. Support and encouragement provided.

## 2022-11-03 NOTE — Group Note (Signed)
Date:  11/03/2022 Time:  11:37 AM  Group Topic/Focus:  Goals Group:   The focus of this group is to help patients establish daily goals to achieve during treatment and discuss how the patient can incorporate goal setting into their daily lives to aide in recovery. Orientation:   The focus of this group is to educate the patient on the purpose and policies of crisis stabilization and provide a format to answer questions about their admission.  The group details unit policies and expectations of patients while admitted.    Participation Level:  Active  Participation Quality:  Attentive  Affect:  Appropriate  Cognitive:  Disorganized  Insight: Appropriate  Engagement in Group:  Engaged  Modes of Intervention:  Discussion  Additional Comments:  Patient attended group and was attentive the duration of it. Patient's goal was to come up with a discharge plan.   Justinian Miano T Lorraine Lax 11/03/2022, 11:37 AM
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
## 2022-11-03 NOTE — Discharge Summary (Signed)
Physician Discharge Summary Note  Patient:  Jeffrey Delgado is an 22 y.o., male MRN:  [id number] DOB:  Mar 01, 2001 Patient phone:  [phone] (home)  Patient address:   [address] [place] [address],  Total Time spent with patient: 30 minutes  Date of Admission:  10/25/2022 Date of Discharge:   11/03/2022  Reason for Admission:   Jeffrey A.  Delgado is a 22 year old male with prior history of psychiatric diagnoses significant for major depressive disorder, ADHD, history of autism spectrum, who presents voluntarily to [facility] from [facility] for worsening depression with recurrent suicidal thought with plan to self-harm and end his life and not wake up in the context of break-up with girlfriend.   Principal Problem: MDD (major depressive disorder), recurrent episode, severe (HCC) Discharge Diagnoses: Principal Problem:   MDD (major depressive disorder), recurrent episode, severe (HCC) Active Problems:   MDD (major depressive disorder)   Past Psychiatric History: Previous Psych Diagnoses: Yes, MDD, anxiety, ADHD, autism spectrum, developmental delay, DMDD, receptive expressive disorder, and SI Prior inpatient treatment: Yes at [facility], at age 77 to 22 years old due to assaulting police officers and destruction of property. Current/prior outpatient treatment: Patient seeing a counselor currently.   Prior rehab hx: Patient denies Psychotherapy hx: Yes History of suicide: Yes x 1 in the past History of homicide or aggression: Has history of genocide thoughts Psychiatric medication history: Yes, fluoxetine and Trileptal Psychiatric medication compliance history: None compliance Neuromodulation history: Denies  Current Psychiatrist: Denies having a psychiatrist Current therapist: Currently seeing a counselor  Past Medical History:  Past Medical History:  Diagnosis Date   ADHD (attention deficit hyperactivity disorder)    Autism spectrum  disorder    Developmental delay    Diabetes mellitus without complication (HCC)    Type I   Dysgraphia    History of tympanoplasty of right ear    [facility] 2015   Receptive-expressive language delay     Past Surgical History:  Procedure Laterality Date   ingrown toenails Bilateral    TONSILLECTOMY AND ADENOIDECTOMY     tubes in ear     and another ear surgery to fix the hole where the tubes fell out   Family History:  Family History  Problem Relation Age of Onset   ADD / ADHD Mother    Crohn's disease Mother    Schizophrenia Father    Family Psychiatric  History: Psych: Has history of schizophrenia, mom suffering from paranoia Psych Rx: Yes SA/HA: Dad Substance use family hx: Grandmother smokes cigarettes, and she is alcoholic   Social History:  Social History   Substance and Sexual Activity  Alcohol Use No     Social History   Substance and Sexual Activity  Drug Use No    Social History   Socioeconomic History   Marital status: Single    Spouse name: Not on file   Number of children: Not on file   Years of education: Not on file   Highest education level: Not on file  Occupational History   Not on file  Tobacco Use   Smoking status: Never   Smokeless tobacco: Never  Vaping Use   Vaping Use: Never used  Substance and Sexual Activity   Alcohol use: No   Drug use: No   Sexual activity: Never  Other Topics Concern   Not on file  Social History Narrative   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  Participates in taekwondo   Social Determinants of Health  Financial Resource Strain: Not on file  Food Insecurity: Patient Declined (10/25/2022)   Hunger Vital Sign    Worried About Running Out of Food in the Last Year: Patient declined    Ran Out of Food in the Last Year: Patient declined  Transportation Needs: Patient Declined (10/25/2022)   PRAPARE - Administrator, Civil Service (Medical): Patient declined    Lack of Transportation (Non-Medical): Patient  declined  Physical Activity: Not on file  Stress: Not on file  Social Connections: Not on file    Hospital Course:  During the patient's hospitalization, patient had extensive initial psychiatric evaluation, and follow-up psychiatric evaluations every day.  Psychiatric diagnoses provided upon initial assessment:  Major depressive disorder  Patient's psychiatric medications were adjusted on admission:  Fluoxetine 40 mg p.o. daily for depression Hydroxyzine 25 mg p.o. 3 times daily as needed anxiety Melatonin 3 mg p.o. nightly for insomnia as needed Abilify 2 mg p.o. daily to augment antidepressant/mood stabilization  Agitation protocol: Benadryl capsule 50 mg p.o. 3 times daily as needed agitation or Benadryl injection 50 mg IM 3 times daily as needed agitation Haldol tablets 5 mg po 3 times daily as needed agitation or Haldol lactate injection 5 mg IM 3 times daily as needed agitation   During the hospitalization, other adjustments were made to the patient's psychiatric medication regimen:  Abilify 5 mg p.o. daily for mood stabilization/augmentation of anxiety depression. Trazodone 50 mg p.o. daily at bedtime for insomnia  Patient's care was discussed during the interdisciplinary team meeting every day during the hospitalization.  The patient denies having side effects to prescribed psychiatric medication.  Gradually, patient started adjusting to milieu. The patient was evaluated each day by a clinical provider to ascertain response to treatment. Improvement was noted by the patient's report of decreasing symptoms, improved sleep and appetite, affect, medication tolerance, behavior, and participation in unit programming.  Patient was asked each day to complete a self inventory noting mood, mental status, pain, new symptoms, anxiety and concerns.    Symptoms were reported as significantly decreased or resolved completely by discharge.   On day of discharge, the patient reports that  their mood is stable. The patient denied having suicidal thoughts for more than 48 hours prior to discharge.  Patient denies having homicidal thoughts.  Patient denies having auditory hallucinations.  Patient denies any visual hallucinations or other symptoms of psychosis. The patient was motivated to continue taking medication with a goal of continued improvement in mental health.   The patient reports their target psychiatric symptoms of depression responded well to the psychiatric medications, and the patient reports overall benefit other psychiatric hospitalization. Supportive psychotherapy was provided to the patient. The patient also participated in regular group therapy while hospitalized. Coping skills, problem solving as well as relaxation therapies were also part of the unit programming.  Labs were reviewed with the patient, and abnormal results were discussed with the patient.  The patient is able to verbalize their individual safety plan to this provider.  # It is recommended to the patient to continue psychiatric medications as prescribed, after discharge from the hospital.    # It is recommended to the patient to follow up with your outpatient psychiatric provider and PCP.  # It was discussed with the patient, the impact of alcohol, drugs, tobacco have been there overall psychiatric and medical wellbeing, and total abstinence from substance use was recommended the patient.ed.  # Prescriptions provided or sent directly to preferred pharmacy at  discharge. Patient agreeable to plan. Given opportunity to ask questions. Appears to feel comfortable with discharge.    # In the event of worsening symptoms, the patient is instructed to call the crisis hotline, 911 and or go to the nearest ED for appropriate evaluation and treatment of symptoms. To follow-up with primary care provider for other medical issues, concerns and or health care needs  # Patient was discharged home with a plan to  follow up as noted below.  Physical Findings: AIMS: Facial and Oral Movements Muscles of Facial Expression: None, normal Lips and Perioral Area: None, normal Jaw: None, normal Tongue: None, normal,Extremity Movements Upper (arms, wrists, hands, fingers): None, normal Lower (legs, knees, ankles, toes): None, normal, Trunk Movements Neck, shoulders, hips: None, normal, Overall Severity Severity of abnormal movements (highest score from questions above): None, normal Incapacitation due to abnormal movements: None, normal Patient's awareness of abnormal movements (rate only patient's report): No Awareness, Dental Status Current problems with teeth and/or dentures?: No Does patient usually wear dentures?: No  CIWA:    COWS:     Musculoskeletal: Strength & Muscle Tone: within normal limits Gait & Station: normal Patient leans: N/A  Psychiatric Specialty Exam:  Presentation  General Appearance:  Casual; Fairly Groomed; Appropriate for Environment  Eye Contact: Good  Speech: Clear and Coherent; Normal Rate  Speech Volume: Normal  Handedness: Right  Mood and Affect  Mood: Depressed (Improving)  Affect: Congruent  Thought Process  Thought Processes: Coherent; Linear  Descriptions of Associations:Intact  Orientation:Full (Time, Place and Person)  Thought Content:Logical  History of Schizophrenia/Schizoaffective disorder:No  Duration of Psychotic Symptoms:No data recorded Hallucinations:Hallucinations: None  Ideas of Reference:None  Suicidal Thoughts:Suicidal Thoughts: No SI Passive Intent and/or Plan: -- (Denies)  Homicidal Thoughts:Homicidal Thoughts: No  Sensorium  Memory: Immediate Good; Recent Good  Judgment: Fair  Insight: Fair  Art therapist  Concentration: Good  Attention Span: Good  Recall: Fair  Fund of Knowledge: Fair  Language: Good  Psychomotor Activity  Psychomotor Activity: Psychomotor Activity:  Normal  Assets  Assets: Communication Skills; Desire for Improvement; Housing; Physical Health; Social Support  Sleep  Sleep: Sleep: Good Number of Hours of Sleep: 7  Physical Exam: Physical Exam Vitals and nursing note reviewed.  Constitutional:      Appearance: He is normal weight.  HENT:     Head: Normocephalic.     Nose: Nose normal.     Mouth/Throat:     Mouth: Mucous membranes are moist.     Pharynx: Oropharynx is clear.  Eyes:     Pupils: Pupils are equal, round, and reactive to light.  Cardiovascular:     Rate and Rhythm: Normal rate.     Pulses: Normal pulses.  Pulmonary:     Effort: Pulmonary effort is normal.  Abdominal:     Comments: Deferred  Genitourinary:    Comments: Deferred  Musculoskeletal:        General: Normal range of motion.     Cervical back: Normal range of motion.  Skin:    General: Skin is warm.  Neurological:     General: No focal deficit present.     Mental Status: He is alert and oriented to person, place, and time.  Psychiatric:        Mood and Affect: Mood normal.        Behavior: Behavior normal.        Thought Content: Thought content normal.    Review of Systems  Constitutional:  Negative for fever.  HENT: Negative.    Eyes: Negative.   Respiratory:  Negative for shortness of breath.   Cardiovascular:  Negative for chest pain.  Gastrointestinal:  Negative for nausea and vomiting.  Genitourinary: Negative.   Musculoskeletal: Negative.   Skin:  Negative for itching and rash.       Whole-body tattoos  Neurological:  Positive for seizures (History of childhood seizures).  Endo/Heme/Allergies:        See allergy listing  Psychiatric/Behavioral:  Positive for depression (Stable with medication). The patient is nervous/anxious (Improved with medication) and has insomnia (Improved with medication).    Blood pressure 112/60, pulse 98, temperature 98.2 F (36.8 C), temperature source Oral, resp. rate 16, height 5\' 6"  (1.676  m), weight 59.9 kg, SpO2 100 %. Body mass index is 21.31 kg/m.   Social History   Tobacco Use  Smoking Status Never  Smokeless Tobacco Never   Tobacco Cessation:  N/A, patient does not currently use tobacco products   Blood Alcohol level:  Lab Results  Component Value Date   ETH <10 10/23/2022   ETH <10 10/20/2019    Metabolic Disorder Labs:  Lab Results  Component Value Date   HGBA1C 7.0 (H) 10/23/2022   MPG 154.2 10/23/2022   MPG 128 05/25/2021   Lab Results  Component Value Date   PROLACTIN 5.3 10/23/2022   Lab Results  Component Value Date   CHOL 188 10/23/2022   TRIG 66 10/23/2022   HDL 69 10/23/2022   CHOLHDL 2.7 10/23/2022   VLDL 13 10/23/2022   LDLCALC 106 (H) 10/23/2022    See Psychiatric Specialty Exam and Suicide Risk Assessment completed by Attending Physician prior to discharge.  Discharge destination:  Home  Is patient on multiple antipsychotic therapies at discharge:  No   Has Patient had three or more failed trials of antipsychotic monotherapy by history:  No  Recommended Plan for Multiple Antipsychotic Therapies: NA  Discharge Instructions     Diet Carb Modified   Complete by: As directed    Increase activity slowly   Complete by: As directed       Allergies as of 11/03/2022       Reactions   Amoxicillin Rash   Did it involve swelling of the face/tongue/throat, SOB, or low BP? No Did it involve sudden or severe rash/hives, skin peeling, or any reaction on the inside of your mouth or nose? No Did you need to seek medical attention at a hospital or doctor's office? Yes When did it last happen?     pt was a baby  If all above answers are "NO", may proceed with cephalosporin use.        Medication List     STOP taking these medications    insulin glargine 100 UNIT/ML injection Commonly known as: LANTUS       TAKE these medications      Indication  ARIPiprazole 5 MG tablet Commonly known as: ABILIFY Take 1 tablet (5  mg total) by mouth daily. Start taking on: Nov 04, 2022  Indication: Major Depressive Disorder   Dexcom G7 Sensor Misc 1 each by Does not apply route daily as needed. Continous Glucose Meter  Indication: Major Depressive Disorder   FLUoxetine 40 MG capsule Commonly known as: PROZAC Take 1 capsule (40 mg total) by mouth daily. Start taking on: Nov 04, 2022  Indication: Major Depressive Disorder   hydrOXYzine 25 MG tablet Commonly known as: ATARAX Take 1 tablet (25 mg total) by mouth 3 (three) times  daily as needed for anxiety (Sleep).  Indication: Feeling Anxious   insulin aspart 100 UNIT/ML injection Commonly known as: novoLOG Inject 0-15 Units into the skin 3 (three) times daily with meals. What changed:  how much to take when to take this additional instructions  Indication: Insulin-Dependent Diabetes   insulin glargine-yfgn 100 UNIT/ML injection Commonly known as: SEMGLEE Inject 0.23 mLs (23 Units total) into the skin daily with breakfast. Start taking on: Nov 04, 2022  Indication: Insulin-Dependent Diabetes   traZODone 50 MG tablet Commonly known as: DESYREL Take 1 tablet (50 mg total) by mouth at bedtime as needed for sleep.  Indication: Trouble Sleeping        Follow-up Information     [facility] Go on 11/04/2022.   Why: Please arrive approximately 15 min prior to 6:45 pm on 04 Nov 2022 Contact information: [address] [place], [place] [phone]        [facility] Follow up.   Contact information: [address]  [address] [place] [address] [phone]                 Follow-up recommendations:   Discharge Recommendations:  The patient is being discharged with his family. Patient is to take his discharge medications as ordered.  See follow up above. We recommend that he participates in individual therapy to target uncontrollable agitation and substance abuse.  We recommend that he  participates in family therapy to target the conflict with his family, to improve communication skills and conflict resolution skills.  Family is to initiate/implement a contingency based behavioral model to address patient's behavior. We recommend that he gets AIMS scale, height, weight, blood pressure, fasting lipid panel, fasting blood sugar in three months from discharge if he's on atypical antipsychotics.  Patient will benefit from monitoring of recurrent suicidal ideation since patient is on antidepressant medication. The patient should abstain from all illicit substances and alcohol.  If the patient's symptoms worsen or do not continue to improve or if the patient becomes actively suicidal or homicidal then it is recommended that the patient return to the closest hospital emergency room or call 911 for further evaluation and treatment. National Suicide Prevention Lifeline 1800-SUICIDE or 502-886-7289. Please follow up with your primary medical doctor for all other medical needs.  The patient has been educated on the possible side effects to medications and he/his guardian is to contact a medical professional and inform outpatient provider of any new side effects of medication. He is to take regular diet and activity as tolerated.  Will benefit from moderate daily exercise. Patient/Family was educated about removing/locking any firearms, medications or dangerous products from the home.   Activity:  As tolerated Diet: Carb modified diet  Signed: Cecilie Lowers, FNP 11/03/2022, 1:07 PM

## 2022-11-03 NOTE — Progress Notes (Signed)
  [facility] Adult Case Management Discharge Plan :  Will you be returning to the same living situation after discharge:  Yes,  (mom) Carroll Sage [phone]  At discharge, do you have transportation home?: Yes,  Pt will be provided transportation to his vehicle located at [facility] Do you have the ability to pay for your medications: Yes,  Insured  Release of information consent forms completed and in the chart;  Patient's signature needed at discharge.  Patient to Follow up at:  Follow-up Information     [facility]. Go on 11/04/2022.   Why: Please arrive approximately 15 min prior to 6:45 pm on 04 Nov 2022 Contact information: [address] [place], [place] [phone]        [facility] Follow up.   Contact information: [address]  [address] [place] [address] [phone]                 Next level of care provider has access to [facility] Link:yes  Safety Planning and Suicide Prevention discussed: Yes,  (mom) Carroll Sage [phone]      Has patient been referred to the Quitline?: Patient does not use tobacco/nicotine products  Patient has been referred for addiction treatment: Yes, the patient will follow up with an outpatient provider for substance use disorder. Psychiatrist/APP: appointment made, Therapist: appointment made, and PCP: appointment made Patient to continue working towards treatment goals after discharge. Patient no longer meets criteria for inpatient criteria per attending physician. Continue taking medications as prescribed, nursing to provide instructions at discharge. Follow up with all scheduled appointments.   Lamica Mccart S Saquoia Sianez, LCSW 11/03/2022, 9:35 AM

## 2022-11-03 NOTE — Progress Notes (Signed)
   11/03/22 0900  Psych Admission Type (Psych Patients Only)  Admission Status Involuntary  Psychosocial Assessment  Patient Complaints Depression  Eye Contact Fair  Facial Expression Flat  Affect Depressed;Sad  Speech Logical/coherent  Interaction Minimal  Motor Activity Slow  Appearance/Hygiene Unremarkable  Behavior Characteristics Cooperative;Appropriate to situation  Mood Depressed;Sad  Thought Process  Coherency WDL  Content WDL  Delusions None reported or observed  Perception WDL  Hallucination None reported or observed  Judgment Limited  Confusion None  Danger to Self  Current suicidal ideation? Denies  Description of Suicide Plan No plan  Self-Injurious Behavior No self-injurious ideation or behavior indicators observed or expressed   Agreement Not to Harm Self Yes  Description of Agreement Pt verbally contracts for safety  Danger to Others  Danger to Others None reported or observed

## 2022-11-03 NOTE — Group Note (Signed)
Recreation Therapy Group Note   Group Topic:Communication  Group Date: 11/03/2022 Start Time: 0930 End Time: 1000 Facilitators: Takahiro Godinho-McCall, LRT,CTRS Location: [address] Dayroom   Goal Area(s) Addresses:  Patient will effectively listen to complete activity.  Patient will identify communication skills used to make activity successful.  Patient will identify how skills used during activity can be used to reach post d/c goals.    Group Description:  Geometric Drawings.  Three volunteers from the peer group will be shown an abstract picture with a particular arrangement of geometrical shapes.  Each round, one 'speaker' will describe the pattern, as accurately as possible without revealing the image to the group.  The remaining group members will listen and draw the picture to reflect how it is described to them. Patients with the role of 'listener' cannot ask clarifying questions but, may request that the speaker repeat a direction. Once the drawings are complete, the presenter will show the rest of the group the picture and compare how close each person came to drawing the picture. LRT will facilitate a post-activity discussion regarding effective communication and the importance of planning, listening, and asking for clarification in daily interactions with others.   Affect/Mood: N/A   Participation Level: Did not attend    Clinical Observations/Individualized Feedback:     Plan: Continue to engage patient in RT group sessions 2-3x/week.   Donelle Hise-McCall, LRT,CTRS 11/03/2022 12:12 PM

## 2022-11-03 NOTE — Progress Notes (Signed)
Patient discharged from [facility] on 11/03/22 at 1145. Patient denies SI, plan, and intention. Suicide safety plan completed, reviewed with this RN, given to the patient, and a copy in the chart. Patient denies HI/AVH upon discharge. Patient is alert, oriented, and cooperative. RN provided patient with discharge paperwork and reviewed information with patient. Patient expressed that he understood all of the discharge instructions. Pt was satisfied with belongings returned to him from the locker and at bedside. Discharged patient to [facility] waiting room. Safe transport awaiting patient in the [facility] waiting room.

## 2022-12-14 ENCOUNTER — Ambulatory Visit: Payer: Self-pay | Admitting: Family Medicine

## 2022-12-31 ENCOUNTER — Encounter (HOSPITAL_COMMUNITY): Payer: Self-pay

## 2022-12-31 ENCOUNTER — Other Ambulatory Visit: Payer: Self-pay

## 2022-12-31 ENCOUNTER — Emergency Department (HOSPITAL_COMMUNITY)
Admission: EM | Admit: 2022-12-31 | Discharge: 2022-12-31 | Disposition: A | Discharge status: Home / Self Care | Payer: MEDICAID | Attending: Emergency Medicine | Admitting: Emergency Medicine

## 2022-12-31 ENCOUNTER — Ambulatory Visit (HOSPITAL_COMMUNITY)
Admission: EM | Admit: 2022-12-31 | Discharge: 2022-12-31 | Disposition: A | Discharge status: Home / Self Care | Payer: MEDICAID

## 2022-12-31 DIAGNOSIS — R739 Hyperglycemia, unspecified: Secondary | ICD-10-CM

## 2022-12-31 DIAGNOSIS — F12920 Cannabis use, unspecified with intoxication, uncomplicated: Secondary | ICD-10-CM

## 2022-12-31 DIAGNOSIS — E1165 Type 2 diabetes mellitus with hyperglycemia: Secondary | ICD-10-CM | POA: Insufficient documentation

## 2022-12-31 DIAGNOSIS — Z794 Long term (current) use of insulin: Secondary | ICD-10-CM | POA: Diagnosis not present

## 2022-12-31 DIAGNOSIS — E1065 Type 1 diabetes mellitus with hyperglycemia: Secondary | ICD-10-CM

## 2022-12-31 LAB — RAPID URINE DRUG SCREEN, HOSP PERFORMED
Amphetamines: NOT DETECTED
Barbiturates: NOT DETECTED
Benzodiazepines: NOT DETECTED
Cocaine: NOT DETECTED
Opiates: NOT DETECTED
Tetrahydrocannabinol: POSITIVE — AB

## 2022-12-31 LAB — CBC
HCT: 45.5 % (ref 39.0–52.0)
Hemoglobin: 15.1 g/dL (ref 13.0–17.0)
MCH: 30.7 pg (ref 26.0–34.0)
MCHC: 33.2 g/dL (ref 30.0–36.0)
MCV: 92.5 fL (ref 80.0–100.0)
Platelets: 202 10*3/uL (ref 150–400)
RBC: 4.92 MIL/uL (ref 4.22–5.81)
RDW: 10.8 % — ABNORMAL LOW (ref 11.5–15.5)
WBC: 10.1 10*3/uL (ref 4.0–10.5)
nRBC: 0 % (ref 0.0–0.2)

## 2022-12-31 LAB — URINALYSIS, ROUTINE W REFLEX MICROSCOPIC
Bilirubin Urine: NEGATIVE
Glucose, UA: >=500 mg/dL — AB
Hgb urine dipstick: NEGATIVE
Ketones, ur: 5 mg/dL — AB
Leukocytes,Ua: NEGATIVE
Nitrite: NEGATIVE
Protein, ur: NEGATIVE mg/dL
Specific Gravity, Urine: 1.037 — ABNORMAL HIGH (ref 1.005–1.030)
pH: 5 (ref 5.0–8.0)

## 2022-12-31 LAB — BASIC METABOLIC PANEL
Anion gap: 10 (ref 5–15)
BUN: 22 mg/dL — ABNORMAL HIGH (ref 6–20)
CO2: 26 mmol/L (ref 22–32)
Calcium: 9.1 mg/dL (ref 8.9–10.3)
Chloride: 100 mmol/L (ref 98–111)
Creatinine, Ser: 0.96 mg/dL (ref 0.61–1.24)
GFR, Estimated: >60 mL/min (ref >60)
Glucose, Bld: 326 mg/dL — ABNORMAL HIGH (ref 70–99)
Potassium: 3.7 mmol/L (ref 3.5–5.1)
Sodium: 136 mmol/L (ref 135–145)

## 2022-12-31 LAB — CBG MONITORING, ED
Glucose-Capillary: 330 mg/dL — ABNORMAL HIGH (ref 70–99)
Glucose-Capillary: 286 mg/dL — ABNORMAL HIGH (ref 70–99)
Glucose-Capillary: 212 mg/dL — ABNORMAL HIGH (ref 70–99)

## 2022-12-31 LAB — ETHANOL: Alcohol, Ethyl (B): <10 mg/dL (ref <10)

## 2022-12-31 MED ORDER — SODIUM CHLORIDE 0.9 % IV BOLUS
1000.0000 mL | Freq: Once | INTRAVENOUS | Status: AC
  Administered 2022-12-31: 1000 mL via INTRAVENOUS

## 2022-12-31 MED ORDER — INSULIN ASPART 100 UNIT/ML IJ SOLN: 5.0000 [IU] | Freq: Once | INTRAMUSCULAR | Status: DC

## 2022-12-31 NOTE — ED Provider Notes (Signed)
[facility] AT [facility] Provider Note   CSN: [id number] Arrival date & time: 12/31/22  0847     History  Chief Complaint  Patient presents with   Hyperglycemia    Romelo A Norland is a 22 y.o. male.  Patient is a 22 year old male who presents with dizziness and fatigue.  He does state his blood sugars have been running high.  He is an insulin-dependent diabetic.  He has had some increased thirst and increased urine.  He does state that he took a THC gummy last night as well as vaping and drinking hard [employer].  He denies any alcohol use.  He denies any other drug use.  No fevers.  No cough or cold symptoms.  No burning on urination.  No vomiting although he has had some nausea.       Home Medications Prior to Admission medications   Medication Sig Start Date End Date Taking? Authorizing Provider  ARIPiprazole (ABILIFY) 5 MG tablet Take 1 tablet (5 mg total) by mouth daily. 11/04/22   Ntuen, Jesusita Oka, FNP  Continuous Glucose Sensor (DEXCOM G7 SENSOR) MISC 1 each by Does not apply route daily as needed. Continous Glucose Meter    [provider]  FLUoxetine (PROZAC) 40 MG capsule Take 1 capsule (40 mg total) by mouth daily. 11/04/22   Ntuen, Jesusita Oka, FNP  hydrOXYzine (ATARAX) 25 MG tablet Take 1 tablet (25 mg total) by mouth 3 (three) times daily as needed for anxiety (Sleep). 11/03/22   Ntuen, Jesusita Oka, FNP  insulin aspart (NOVOLOG) 100 UNIT/ML injection Inject 0-15 Units into the skin 3 (three) times daily with meals. 11/03/22   Ntuen, Jesusita Oka, FNP  insulin glargine-yfgn (SEMGLEE) 100 UNIT/ML injection Inject 0.23 mLs (23 Units total) into the skin daily with breakfast. 11/04/22   Ntuen, Jesusita Oka, FNP  traZODone (DESYREL) 50 MG tablet Take 1 tablet (50 mg total) by mouth at bedtime as needed for sleep. 11/03/22   Cecilie Lowers, FNP      Allergies    Amoxicillin    Review of Systems   Review of Systems  Constitutional:  Positive for fatigue.  Negative for chills, diaphoresis and fever.  HENT:  Negative for congestion, rhinorrhea and sneezing.   Eyes: Negative.   Respiratory:  Negative for cough, chest tightness and shortness of breath.   Cardiovascular:  Negative for chest pain and leg swelling.  Gastrointestinal:  Positive for nausea. Negative for abdominal pain, blood in stool, diarrhea and vomiting.  Endocrine: Positive for polydipsia and polyphagia.  Genitourinary:  Negative for difficulty urinating, flank pain, frequency and hematuria.  Musculoskeletal:  Negative for arthralgias and back pain.  Skin:  Negative for rash.  Neurological:  Positive for light-headedness. Negative for dizziness, speech difficulty, weakness, numbness and headaches.    Physical Exam Updated Vital Signs BP (!) 107/52   Pulse (!) 56   Temp 98.4 F (36.9 C) (Oral)   Resp 14   Ht 5\' 6"  (1.676 m)   Wt 59.9 kg   SpO2 96%   BMI 21.31 kg/m  Physical Exam Constitutional:      Appearance: He is well-developed.  HENT:     Head: Normocephalic and atraumatic.  Eyes:     Pupils: Pupils are equal, round, and reactive to light.  Cardiovascular:     Rate and Rhythm: Normal rate and regular rhythm.     Heart sounds: Normal heart sounds.  Pulmonary:     Effort: Pulmonary  effort is normal. No respiratory distress.     Breath sounds: Normal breath sounds. No wheezing or rales.  Chest:     Chest wall: No tenderness.  Abdominal:     General: Bowel sounds are normal.     Palpations: Abdomen is soft.     Tenderness: There is no abdominal tenderness. There is no guarding or rebound.  Musculoskeletal:        General: Normal range of motion.     Cervical back: Normal range of motion and neck supple.  Lymphadenopathy:     Cervical: No cervical adenopathy.  Skin:    General: Skin is warm and dry.     Findings: No rash.  Neurological:     General: No focal deficit present.     Mental Status: He is alert and oriented to person, place, and time.      ED Results / Procedures / Treatments   Labs (all labs ordered are listed, but only abnormal results are displayed) Labs Reviewed  BASIC METABOLIC PANEL - Abnormal; Notable for the following components:      Result Value   Glucose, Bld 326 (*)    BUN 22 (*)    All other components within normal limits  CBC - Abnormal; Notable for the following components:   RDW 10.8 (*)    All other components within normal limits  URINALYSIS, ROUTINE W REFLEX MICROSCOPIC - Abnormal; Notable for the following components:   Specific Gravity, Urine 1.037 (*)    Glucose, UA >=500 (*)    Ketones, ur 5 (*)    Bacteria, UA RARE (*)    All other components within normal limits  RAPID URINE DRUG SCREEN, HOSP PERFORMED - Abnormal; Notable for the following components:   Tetrahydrocannabinol POSITIVE (*)    All other components within normal limits  CBG MONITORING, ED - Abnormal; Notable for the following components:   Glucose-Capillary 330 (*)    All other components within normal limits  CBG MONITORING, ED - Abnormal; Notable for the following components:   Glucose-Capillary 286 (*)    All other components within normal limits  CBG MONITORING, ED - Abnormal; Notable for the following components:   Glucose-Capillary 212 (*)    All other components within normal limits  ETHANOL    EKG EKG Interpretation Date/Time:  Friday December 31 2022 12:09:55 EDT Ventricular Rate:  69 PR Interval:  131 QRS Duration:  74 QT Interval:  354 QTC Calculation: 380 R Axis:   68  Text Interpretation: Sinus rhythm similar to prior EKG Confirmed by Rolan Bucco [phone]) on 12/31/2022 12:17:11 PM  Radiology No results found.  Procedures Procedures    Medications Ordered in ED Medications  sodium chloride 0.9 % bolus 1,000 mL (0 mLs Intravenous Stopped 12/31/22 1336)    ED Course/ Medical Decision Making/ A&P                             Medical Decision Making Amount and/or Complexity of Data  Reviewed Labs: ordered.  Risk Prescription drug management.   Patient is a 22 year old who presents with some dizziness and fatigue.  His blood sugars have been elevated.  He does have elevation in his blood sugars in the 200 range but he does not have any signs of DKA.  On the BNP is glucose was just over 300.  He was given IV fluids and his glucoses come down to the low 200s.  I suspect  much of his symptoms are related to him doing THC Gummies last night in association with multiple energy drinks.  He is awake and oriented and fairly asymptomatic at this point other than some fatigue.  I do not see any suggestions of infection.  His vital signs are stable.  He does not have any unilateral weakness or other concerns for stroke.  No other symptoms more concerning for ACS.  He was discharged home in good condition.  He was encouraged to have close follow-up with his PCP.  Return precautions were given.  Final Clinical Impression(s) / ED Diagnoses Final diagnoses:  Hyperglycemia    Rx / DC Orders ED Discharge Orders     None         Rolan Bucco, MD 12/31/22 1552

## 2022-12-31 NOTE — ED Provider Triage Note (Signed)
Emergency Medicine Provider Triage Evaluation Note  Jeffrey Delgado , a 22 y.o. male  was evaluated in triage.  Pt complains of dizziness and nausea with weakness.  He does have a history of diabetes, insulin-dependent and states his blood sugars have been running "high".  He has been taking his insulin as directed.  He does report that he took some THC Gummies and has been feeling bad since that time.  No fevers.  No cough or cold symptoms.  No vomiting.  Review of Systems  Positive: Polyuria, polydipsia, weakness, dizziness Negative: Fevers, cough, congestion, chest pain  Physical Exam  BP 112/71 (BP Location: Right Arm)   Pulse 92   Temp 98.8 F (37.1 C) (Oral)   Resp 14   Ht 5\' 6"  (1.676 m)   Wt 59.9 kg   SpO2 95%   BMI 21.31 kg/m  Gen:   Awake, no distress slightly sleepy, slower to answer questions but awake and alert Resp:  Normal effort   MSK:   Moves extremities without difficulty   Other:     Medical Decision Making  Medically screening exam initiated at 9:02 AM.  Appropriate orders placed.  Cong A Farar was informed that the remainder of the evaluation will be completed by another provider, this initial triage assessment does not replace that evaluation, and the importance of remaining in the ED until their evaluation is complete.      Rolan Bucco, MD 12/31/22 [phone]

## 2022-12-31 NOTE — ED Notes (Addendum)
As per Dr. Fredderick Phenix we will wait until the bag of fluids are done before next CBG

## 2022-12-31 NOTE — ED Triage Notes (Addendum)
Patient here today with c/o dizziness and nausea after taking 2 gummies yesterday. Patient stated that the symptoms started 30 minutes after taking them and hasn't gone away. Has Continuous Glucometer read 283.

## 2022-12-31 NOTE — ED Provider Notes (Signed)
[facility]    CSN: [id number] Arrival date & time: 12/31/22  4401      History   Chief Complaint Chief Complaint  Patient presents with   Dizziness    HPI Jeffrey Delgado is a 22 y.o. male.   Patient presents to clinic with complaints of dizziness and nausea.  He took to 800 mg THC Gummies last night around midnight, vaped and drinking [employer] hard lemonade.  Symptoms started 30 minutes after taking the Gummies and have not improved.  Reports he has not a regular THC user, this was his first time.  He is a DM1, has ADHD, ASD, developmental delay, MDD, and  DMDD.   Current CBG 293 from patient's continuous glucose monitoring on the back of his left arm.  He is unsure if he gave himself insulin this morning.  Has not had anything to eat or drink.  The history is provided by the patient and medical records.  Dizziness Associated symptoms: nausea     Past Medical History:  Diagnosis Date   ADHD (attention deficit hyperactivity disorder)    Autism spectrum disorder    Developmental delay    Diabetes mellitus without complication (HCC)    Type I   Dysgraphia    History of tympanoplasty of right ear    [facility] 2015   Receptive-expressive language delay     Patient Active Problem List   Diagnosis Date Noted   MDD (major depressive disorder) 10/25/2022   Episode of recurrent major depressive disorder (HCC) 03/19/2021   Anxiety state 03/19/2021   Attention deficit hyperactivity disorder (ADHD) 03/19/2021   Suicidal ideations    DMDD (disruptive mood dysregulation disorder) (HCC)    Major depression, chronic 10/22/2019   MDD (major depressive disorder), recurrent episode, severe (HCC) 10/22/2019   Diabetes mellitus without complication (HCC)    History of tympanoplasty of right ear    Autism spectrum disorder    Developmental delay    Dysgraphia    Receptive-expressive language delay     Past Surgical History:  Procedure Laterality Date   ingrown  toenails Bilateral    TONSILLECTOMY AND ADENOIDECTOMY     tubes in ear     and another ear surgery to fix the hole where the tubes fell out       Home Medications    Prior to Admission medications   Medication Sig Start Date End Date Taking? Authorizing Provider  ARIPiprazole (ABILIFY) 5 MG tablet Take 1 tablet (5 mg total) by mouth daily. 11/04/22   Ntuen, Jesusita Oka, FNP  Continuous Glucose Sensor (DEXCOM G7 SENSOR) MISC 1 each by Does not apply route daily as needed. Continous Glucose Meter    [provider]  FLUoxetine (PROZAC) 40 MG capsule Take 1 capsule (40 mg total) by mouth daily. 11/04/22   Ntuen, Jesusita Oka, FNP  hydrOXYzine (ATARAX) 25 MG tablet Take 1 tablet (25 mg total) by mouth 3 (three) times daily as needed for anxiety (Sleep). 11/03/22   Ntuen, Jesusita Oka, FNP  insulin aspart (NOVOLOG) 100 UNIT/ML injection Inject 0-15 Units into the skin 3 (three) times daily with meals. 11/03/22   Ntuen, Jesusita Oka, FNP  insulin glargine-yfgn (SEMGLEE) 100 UNIT/ML injection Inject 0.23 mLs (23 Units total) into the skin daily with breakfast. 11/04/22   Ntuen, Jesusita Oka, FNP  traZODone (DESYREL) 50 MG tablet Take 1 tablet (50 mg total) by mouth at bedtime as needed for sleep. 11/03/22   Cecilie Lowers, FNP  Family History Family History  Problem Relation Age of Onset   ADD / ADHD Mother    Crohn's disease Mother    Schizophrenia Father     Social History Social History   Tobacco Use   Smoking status: Never   Smokeless tobacco: Never  Vaping Use   Vaping status: Never Used  Substance Use Topics   Alcohol use: No   Drug use: No     Allergies   Amoxicillin   Review of Systems Review of Systems  Gastrointestinal:  Positive for nausea.  Neurological:  Positive for dizziness.     Physical Exam Triage Vital Signs ED Triage Vitals  Encounter Vitals Group     BP 12/31/22 0808 107/64     Systolic BP Percentile --      Diastolic BP Percentile --      Pulse Rate 12/31/22 0808  88     Resp 12/31/22 0808 16     Temp 12/31/22 0808 98.2 F (36.8 C)     Temp Source 12/31/22 0808 Oral     SpO2 12/31/22 0808 94 %     Weight 12/31/22 0809 132 lb (59.9 kg)     Height 12/31/22 0809 5\' 6"  (1.676 m)     Head Circumference --      Peak Flow --      Pain Score 12/31/22 0814 0     Pain Loc --      Pain Education --      Exclude from Growth Chart --    No data found.  Updated Vital Signs BP 107/64 (BP Location: Right Arm)   Pulse 88   Temp 98.2 F (36.8 C) (Oral)   Resp 16   Ht 5\' 6"  (1.676 m)   Wt 132 lb (59.9 kg)   SpO2 94%   BMI 21.31 kg/m   Visual Acuity Right Eye Distance:   Left Eye Distance:   Bilateral Distance:    Right Eye Near:   Left Eye Near:    Bilateral Near:     Physical Exam Vitals and nursing note reviewed.  Constitutional:      General: He is awake.     Appearance: He is normal weight.  HENT:     Head: Normocephalic and atraumatic.     Right Ear: External ear normal.     Left Ear: External ear normal.     Nose: Nose normal.     Mouth/Throat:     Mouth: Mucous membranes are dry.  Cardiovascular:     Rate and Rhythm: Normal rate and regular rhythm.     Heart sounds: Normal heart sounds. No murmur heard. Pulmonary:     Effort: Pulmonary effort is normal. No respiratory distress.     Breath sounds: Normal breath sounds.  Musculoskeletal:        General: Normal range of motion.  Skin:    General: Skin is warm and dry.  Neurological:     General: No focal deficit present.     Mental Status: He is lethargic.  Psychiatric:        Speech: Speech is delayed.        Behavior: Behavior is slowed.      UC Treatments / Results  Labs (all labs ordered are listed, but only abnormal results are displayed) Labs Reviewed - No data to display  EKG   Radiology No results found.  Procedures Procedures (including critical care time)  Medications Ordered in UC Medications - No data to display  Initial Impression /  Assessment and Plan / UC Course  I have reviewed the triage vital signs and the nursing notes.  Pertinent labs & imaging results that were available during my care of the patient were reviewed by me and considered in my medical decision making (see chart for details).  Vitals and triage reviewed, patient appears intoxicated with cannabis in clinic.  Mucous membranes are dry and responses are delayed. CBG approaching 300. Patient unsure if he gave himself insulin, has not ate or drank.  Discussed that if he was tolerating food and fluids, will be safe to go home and detox at home from [facility].  Since he is not tolerating oral food and fluids, has dry mucous membranes, delayed responses, hyperglycemia, and other health issues, advised further evaluation in the emergency department.  Caregiver will transport via POV.    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   Rinaldo Ratel [place], [place] 12/31/22 [phone]

## 2022-12-31 NOTE — ED Notes (Signed)
Patient is being discharged from the Urgent Care and sent to the Emergency Department via self . Per provider, patient is in need of higher level of care due to cannabis intoxication. Patient is aware and verbalizes understanding of plan of care.  Vitals:   12/31/22 0808  BP: 107/64  Pulse: 88  Resp: 16  Temp: 98.2 F (36.8 C)  SpO2: 94%

## 2022-12-31 NOTE — Discharge Instructions (Signed)
Make sure that you are checking your blood sugars frequently at home.  Refrain from doing any more marijuana containing absences.  Make an appointment to have close follow-up with your primary care doctor.  Return to the emergency room if you have any worsening symptoms.

## 2022-12-31 NOTE — ED Triage Notes (Signed)
Pt c/o hyperglycemia. Pt's glucose is 298. Pt c/o polyuria and dizziness started yesterday

## 2023-01-03 ENCOUNTER — Telehealth: Payer: Self-pay

## 2023-01-03 NOTE — Transitions of Care (Post Inpatient/ED Visit) (Signed)
   01/03/2023  Name: Jeffrey Delgado MRN: [id number] DOB: 04/26/2001  Today's TOC FU Call Status: Today's TOC FU Call Status:: Successful TOC FU Call Competed TOC FU Call Complete Date: 01/03/23  Transition Care Management Follow-up Telephone Call Date of Discharge: 12/31/22 Discharge Facility: [facility]) Type of Discharge: Emergency Department Primary Inpatient Discharge Diagnosis:: Hyperglycemia How have you been since you were released from the hospital?: Better Any questions or concerns?: No  Items Reviewed: Did you receive and understand the discharge instructions provided?: Yes Medications obtained,verified, and reconciled?: Yes (Medications Reviewed) Any new allergies since your discharge?: No Dietary orders reviewed?: Yes Do you have support at home?: Yes  Medications Reviewed Today: Medications Reviewed Today     Reviewed by Merleen Nicely, LPN (Licensed Practical Nurse) on 01/03/23 at 1351  Med List Status: <None>   Medication Order Taking? Sig Documenting Provider Last Dose Status Informant  ARIPiprazole (ABILIFY) 5 MG tablet [id number] Yes Take 1 tablet (5 mg total) by mouth daily. Cecilie Lowers, FNP Taking Active   Continuous Glucose Sensor (DEXCOM G7 SENSOR) MISC [id number] Yes 1 each by Does not apply route daily as needed. Continous Glucose Meter [provider] Taking Active Self  FLUoxetine (PROZAC) 40 MG capsule [id number] Yes Take 1 capsule (40 mg total) by mouth daily. Cecilie Lowers, FNP Taking Active   hydrOXYzine (ATARAX) 25 MG tablet [id number] Yes Take 1 tablet (25 mg total) by mouth 3 (three) times daily as needed for anxiety (Sleep). Cecilie Lowers, FNP Taking Active   insulin aspart (NOVOLOG) 100 UNIT/ML injection [id number] Yes Inject 0-15 Units into the skin 3 (three) times daily with meals. Cecilie Lowers, FNP Taking Active   insulin glargine-yfgn (SEMGLEE) 100 UNIT/ML injection [id number] Yes Inject 0.23 mLs (23 Units total) into the skin  daily with breakfast. Ntuen, Jesusita Oka, FNP Taking Active   traZODone (DESYREL) 50 MG tablet [id number] Yes Take 1 tablet (50 mg total) by mouth at bedtime as needed for sleep. Cecilie Lowers, FNP Taking Active             Home Care and Equipment/Supplies: Were Home Health Services Ordered?: No Any new equipment or medical supplies ordered?: No  Functional Questionnaire: Do you need assistance with bathing/showering or dressing?: No Do you need assistance with meal preparation?: No Do you need assistance with eating?: No Do you have difficulty maintaining continence: No Do you need assistance with getting out of bed/getting out of a chair/moving?: No Do you have difficulty managing or taking your medications?: No  Follow up appointments reviewed: PCP Follow-up appointment confirmed?: Yes Date of PCP follow-up appointment?: 01/07/23 Follow-up Provider: [facility] Follow-up appointment confirmed?: No Do you need transportation to your follow-up appointment?: No Do you understand care options if your condition(s) worsen?: Yes-patient verbalized understanding    SIGNATURE  Woodfin Ganja LPN [facility] Nurse Health Advisor Direct Dial [phone]

## 2023-01-07 ENCOUNTER — Inpatient Hospital Stay: Payer: MEDICAID | Admitting: Family Medicine

## 2023-01-10 ENCOUNTER — Encounter: Payer: Self-pay | Admitting: Family Medicine

## 2023-01-10 ENCOUNTER — Ambulatory Visit: Payer: MEDICAID | Admitting: Family Medicine

## 2023-01-10 VITALS — BP 120/62 | HR 102 | Temp 98.1°F | Ht 66.0 in | Wt 150.6 lb

## 2023-01-10 DIAGNOSIS — Z794 Long term (current) use of insulin: Secondary | ICD-10-CM | POA: Diagnosis not present

## 2023-01-10 DIAGNOSIS — E1069 Type 1 diabetes mellitus with other specified complication: Secondary | ICD-10-CM | POA: Diagnosis not present

## 2023-01-10 MED ORDER — INSULIN ASPART 100 UNIT/ML IJ SOLN: 0.0000 [IU] | Freq: Three times a day (TID) | INTRAMUSCULAR | 11 refills | Status: DC

## 2023-01-10 MED ORDER — INSULIN GLARGINE-YFGN 100 UNIT/ML ~~LOC~~ SOLN: 23.0000 [IU] | Freq: Every day | SUBCUTANEOUS | 11 refills | Status: DC

## 2023-01-10 NOTE — Progress Notes (Signed)
Subjective:    Patient ID: Jeffrey Delgado, male    DOB: 2001-04-25, 22 y.o.   MRN: [id number]  HPI  Patient is a 22 year old gentleman who presents today for follow-up from his recent ER visit.  He has a history of type 1 diabetes mellitus.  His most recent A1c in May was acceptable at 7.0.  He states that he has been taking Lantus 23 units daily and NovoLog 3 times a day carb counting with meals.  However in May he was admitted to the hospital [facility].  He was started on Abilify along with Prozac and hydroxyzine.  He states that ever since he started the medication his sugars have been out of control.  He has not seen his endocrinologist.  He has a continuous blood glucose monitor.  I reviewed the information from the last 30 days.  His average blood sugar is well above 300.  He has less than 1% time in range.  80% of his sugars are very high greater than 300.  There are no episodes of hypoglycemia.  Patient believes the medication is causing the change in his sugars.  He states that he never saw this problem until after he got out of the hospital.  However I asked him how he is storing his insulin.  He states that he believes about.  He does not keep any of his insulin in the refrigerator.  He does not know when he got his last Prescription filled.  This leads me to believe that the insulin may have "spoiled" and may no longer be effective Past Medical History:  Diagnosis Date   ADHD (attention deficit hyperactivity disorder)    Autism spectrum disorder    Developmental delay    Diabetes mellitus without complication (HCC)    Type I   Dysgraphia    History of tympanoplasty of right ear    [facility] 2015   Receptive-expressive language delay    Past Surgical History:  Procedure Laterality Date   ingrown toenails Bilateral    TONSILLECTOMY AND ADENOIDECTOMY     tubes in ear     and another ear surgery to fix the hole where the tubes fell out   Current Outpatient  Medications on File Prior to Visit  Medication Sig Dispense Refill   ARIPiprazole (ABILIFY) 5 MG tablet Take 1 tablet (5 mg total) by mouth daily. 30 tablet 0   Continuous Glucose Sensor (DEXCOM G7 SENSOR) MISC 1 each by Does not apply route daily as needed. Continous Glucose Meter     FLUoxetine (PROZAC) 40 MG capsule Take 1 capsule (40 mg total) by mouth daily. 30 capsule 3   hydrOXYzine (ATARAX) 25 MG tablet Take 1 tablet (25 mg total) by mouth 3 (three) times daily as needed for anxiety (Sleep). 30 tablet 0   traZODone (DESYREL) 50 MG tablet Take 1 tablet (50 mg total) by mouth at bedtime as needed for sleep. 30 tablet 0   No current facility-administered medications on file prior to visit.   Allergies  Allergen Reactions   Amoxicillin Rash    Did it involve swelling of the face/tongue/throat, SOB, or low BP? No Did it involve sudden or severe rash/hives, skin peeling, or any reaction on the inside of your mouth or nose? No Did you need to seek medical attention at a hospital or doctor's office? Yes When did it last happen?     pt was a baby  If all above answers are "NO", may proceed  with cephalosporin use.    Social History   Socioeconomic History   Marital status: Single    Spouse name: Not on file   Number of children: Not on file   Years of education: Not on file   Highest education level: Not on file  Occupational History   Not on file  Tobacco Use   Smoking status: Every Day    Types: Cigars   Smokeless tobacco: Never  Vaping Use   Vaping status: Every Day   Substances: Nicotine  Substance and Sexual Activity   Alcohol use: No   Drug use: Yes    Comment: edibles   Sexual activity: Never  Other Topics Concern   Not on file  Social History Narrative   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  Participates in taekwondo   Social Determinants of Health   Financial Resource Strain: Not on file  Food Insecurity: Low Risk  (11/03/2022)   [facility]   Food vital sign    Within the past 12 months, you worried that your food would run out before you got money to buy more: Never true    Within the past 12 months, the food you bought just didn't last and you didn't have money to get more. : Never true  Transportation Needs: No Transportation Needs (11/03/2022)   [facility]   Transportation    In the past 12 months, has lack of reliable transportation kept you from medical appointments, meetings, work or from getting things needed for daily living? : No  Physical Activity: Not on file  Stress: Not on file  Social Connections: Not on file  Intimate Partner Violence: Patient Declined (10/25/2022)   Humiliation, Afraid, Rape, and Kick questionnaire    Fear of Current or Ex-Partner: Patient declined    Emotionally Abused: Patient declined    Physically Abused: Patient declined    Sexually Abused: Patient declined     Review of Systems  All other systems reviewed and are negative.      Objective:   Physical Exam Constitutional:      General: He is not in acute distress.    Appearance: He is not ill-appearing or toxic-appearing.  Cardiovascular:     Rate and Rhythm: Normal rate and regular rhythm.     Pulses: Normal pulses.     Heart sounds: Normal heart sounds. No murmur heard.    No friction rub. No gallop.  Pulmonary:     Effort: Pulmonary effort is normal. No respiratory distress.     Breath sounds: Normal breath sounds. No stridor. No wheezing, rhonchi or rales.  Abdominal:     General: Abdomen is flat. Bowel sounds are normal. There is no distension.     Palpations: Abdomen is soft.     Tenderness: There is no abdominal tenderness. There is no guarding.  Musculoskeletal:     Right lower leg: No edema.     Left lower leg: No edema.  Neurological:     Mental Status: He is alert.  Psychiatric:        Attention and Perception: Attention normal.        Mood and Affect: Affect is flat.         Speech: Speech is delayed.        Behavior: Behavior normal.        Assessment & Plan:  Type 1 diabetes mellitus with other specified complication (HCC) Diabetes is out of control.  We discussed  this at length however the patient is administering his insulin the same as he always has and he states that he has not changed his diet dramatically.  This leads me to believe that his insulin is no longer effective.  After learning how he stores his insulin I am afraid that it may have gone bad.  I sent in a new prescription for the glargine 23 units daily and NovoLog to be taken 3 times a day and meals with a sliding scale.  I asked him to resume his insulin at its current dose using this new fresh insulin.  Reported sugars to me in 2 days.  If sugars do not improve dramatically I would increase his Semglee by 10 units every 3 days until fasting blood sugars are near 150.  Patient will call me on Thursday to give me an update.  I am also going to schedule the patient an appointment with his endocrinologist as he appears to make follow-up.  It also appears that he may need further diabetic education

## 2023-07-26 DIAGNOSIS — E1065 Type 1 diabetes mellitus with hyperglycemia: Secondary | ICD-10-CM | POA: Diagnosis not present

## 2023-07-26 DIAGNOSIS — Z794 Long term (current) use of insulin: Secondary | ICD-10-CM | POA: Diagnosis not present

## 2023-07-26 DIAGNOSIS — E109 Type 1 diabetes mellitus without complications: Secondary | ICD-10-CM | POA: Diagnosis not present

## 2023-07-26 DIAGNOSIS — I2510 Atherosclerotic heart disease of native coronary artery without angina pectoris: Secondary | ICD-10-CM | POA: Diagnosis not present

## 2023-08-02 ENCOUNTER — Ambulatory Visit (INDEPENDENT_AMBULATORY_CARE_PROVIDER_SITE_OTHER): Payer: No Typology Code available for payment source | Admitting: Family Medicine

## 2023-08-02 ENCOUNTER — Encounter: Payer: Self-pay | Admitting: Family Medicine

## 2023-08-02 VITALS — BP 120/70 | HR 104 | Ht 66.0 in | Wt 137.6 lb

## 2023-08-02 DIAGNOSIS — Z0001 Encounter for general adult medical examination with abnormal findings: Secondary | ICD-10-CM

## 2023-08-02 DIAGNOSIS — F251 Schizoaffective disorder, depressive type: Secondary | ICD-10-CM | POA: Diagnosis not present

## 2023-08-02 DIAGNOSIS — F840 Autistic disorder: Secondary | ICD-10-CM | POA: Diagnosis not present

## 2023-08-02 DIAGNOSIS — Z0000 Encounter for general adult medical examination without abnormal findings: Secondary | ICD-10-CM

## 2023-08-02 DIAGNOSIS — E1069 Type 1 diabetes mellitus with other specified complication: Secondary | ICD-10-CM

## 2023-08-02 NOTE — Progress Notes (Signed)
 Subjective:    Patient ID: Jeffrey Delgado, male    DOB: [birth year], 23 y.o.   MRN: [id number]  HPI Patient is here today for complete physical exam.  He is accompanied by his grandfather.  Patient has a history of type 1 diabetes mellitus.  However he recently saw his endocrinologist and his hemoglobin A1c was almost 10.  He states that he is taking 30 units of Lantus daily.  I reviewed his continuous blood glucose monitoring.  For the last 90 days, he is averaging between 250 and 300 consistently.  More than 50% of the time his sugars are in the very high range.  He seldom has hypoglycemia.  His sugars are consistently high throughout the day.  Therefore, I believe that he requires additional insulin beyond the 30 units that he is presently taking.  I recommended that he follow back up with his endocrinologist and provide him an update of his blood sugar so they can adjust his insulin further.  We also discussed dietary changes as he admits that he is eating Snickers bars a lot along with soda.  His immunizations are up-to-date.  He is overdue to check his TSH and his fasting lab work.  Patient refuses any blood work today and states that he will come back on May 15 to do his blood work.   Past Medical History:  Diagnosis Date   ADHD (attention deficit hyperactivity disorder)    Autism spectrum disorder    Developmental delay    Diabetes mellitus without complication (HCC)    Type I   Dysgraphia    History of tympanoplasty of right ear    [facility]   Receptive-expressive language delay    Past Surgical History:  Procedure Laterality Date   ingrown toenails Bilateral    TONSILLECTOMY AND ADENOIDECTOMY     tubes in ear     and another ear surgery to fix the hole where the tubes fell out   Current Outpatient Medications on File Prior to Visit  Medication Sig Dispense Refill   Continuous Glucose Sensor (DEXCOM G7 SENSOR) MISC 1 each by Does not apply route daily as needed. Continous  Glucose Meter     insulin aspart (NOVOLOG) 100 UNIT/ML injection Inject 0-15 Units into the skin 3 (three) times daily with meals. 10 mL 11   insulin glargine (LANTUS) 100 UNIT/ML injection Inject 30 Units into the skin daily.     ARIPiprazole (ABILIFY) 5 MG tablet Take 1 tablet (5 mg total) by mouth daily. (Patient not taking: Reported on 08/02/2023) 30 tablet 0   FLUoxetine (PROZAC) 40 MG capsule Take 1 capsule (40 mg total) by mouth daily. (Patient not taking: Reported on 08/02/2023) 30 capsule 3   hydrOXYzine (ATARAX) 25 MG tablet Take 1 tablet (25 mg total) by mouth 3 (three) times daily as needed for anxiety (Sleep). (Patient not taking: Reported on 08/02/2023) 30 tablet 0   insulin glargine-yfgn (SEMGLEE) 100 UNIT/ML injection Inject 0.23 mLs (23 Units total) into the skin daily with breakfast. (Patient not taking: Reported on 08/02/2023) 10 mL 11   traZODone (DESYREL) 50 MG tablet Take 1 tablet (50 mg total) by mouth at bedtime as needed for sleep. (Patient not taking: Reported on 08/02/2023) 30 tablet 0   No current facility-administered medications on file prior to visit.   Allergies  Allergen Reactions   Amoxicillin Rash    Did it involve swelling of the face/tongue/throat, SOB, or low BP? No Did it involve sudden or  severe rash/hives, skin peeling, or any reaction on the inside of your mouth or nose? No Did you need to seek medical attention at a hospital or doctor's office? Yes When did it last happen?     pt was a baby  If all above answers are "NO", may proceed with cephalosporin use.    Social History   Socioeconomic History   Marital status: Single    Spouse name: Not on file   Number of children: Not on file   Years of education: Not on file   Highest education level: Not on file  Occupational History   Not on file  Tobacco Use   Smoking status: Every Day    Types: Cigars   Smokeless tobacco: Never  Vaping Use   Vaping status: Every Day   Substances: Nicotine   Substance and Sexual Activity   Alcohol use: No   Drug use: Yes    Comment: edibles   Sexual activity: Never  Other Topics Concern   Not on file  Social History Narrative   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  Participates in taekwondo   Social Drivers of Health   Financial Resource Strain: Not on file  Food Insecurity: Low Risk  (11/03/2022)   [facility]   Hunger Vital Sign    Worried About Running Out of Food in the Last Year: Never true    Ran Out of Food in the Last Year: Never true  Transportation Needs: No Transportation Needs (11/03/2022)   [facility]   Transportation    In the past 12 months, has lack of reliable transportation kept you from medical appointments, meetings, work or from getting things needed for daily living? : No  Physical Activity: Not on file  Stress: Not on file  Social Connections: Not on file  Intimate Partner Violence: Patient Declined (10/25/2022)   Humiliation, Afraid, Rape, and Kick questionnaire    Fear of Current or Ex-Partner: Patient declined    Emotionally Abused: Patient declined    Physically Abused: Patient declined    Sexually Abused: Patient declined     Review of Systems  All other systems reviewed and are negative.      Objective:   Physical Exam Constitutional:      General: He is not in acute distress.    Appearance: Normal appearance. He is normal weight. He is not ill-appearing or toxic-appearing.  HENT:     Head: Normocephalic and atraumatic.     Right Ear: Tympanic membrane and ear canal normal.     Left Ear: Tympanic membrane and ear canal normal.     Mouth/Throat:     Mouth: Mucous membranes are moist.     Pharynx: Oropharynx is clear. No oropharyngeal exudate or posterior oropharyngeal erythema.  Eyes:     Extraocular Movements: Extraocular movements intact.     Conjunctiva/sclera: Conjunctivae normal.     Pupils: Pupils are equal, round, and reactive to  light.  Cardiovascular:     Rate and Rhythm: Normal rate and regular rhythm.     Pulses: Normal pulses.     Heart sounds: Normal heart sounds. No murmur heard.    No friction rub. No gallop.  Pulmonary:     Effort: Pulmonary effort is normal. No respiratory distress.     Breath sounds: Normal breath sounds. No stridor. No wheezing, rhonchi or rales.  Abdominal:     General: Abdomen is flat. Bowel sounds are normal. There is no distension.  Palpations: Abdomen is soft.     Tenderness: There is no abdominal tenderness. There is no guarding.  Musculoskeletal:     Right lower leg: No edema.     Left lower leg: No edema.  Skin:    Findings: Lesion present. No erythema.  Neurological:     General: No focal deficit present.     Mental Status: He is alert and oriented to person, place, and time. Mental status is at baseline.     Cranial Nerves: No cranial nerve deficit.     Sensory: No sensory deficit.     Motor: No weakness.     Coordination: Coordination normal.     Gait: Gait normal.     Deep Tendon Reflexes: Reflexes normal.  Psychiatric:        Attention and Perception: Attention normal.        Mood and Affect: Affect is flat.        Speech: Speech is delayed.        Behavior: Behavior normal.   Patient now has tattoos and sleeves of tattoos on both arms as well as his face.  He has piercings in both cheeks.  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  Recommended that he reach out to his endocrinologist so that they can adjust his insulin further based on the values of his continuous blood glucose monitor.  Recommended checking a  urine protein creatinine ratio to evaluate for nephropathy, CBC, CMP, lipid panel, and a TSH.  Patient declines any lab work today.  Immunizations are up-to-date.  Blood pressure is acceptable.  Continue to encourage follow-up with psychiatry

## 2023-08-12 DIAGNOSIS — I2510 Atherosclerotic heart disease of native coronary artery without angina pectoris: Secondary | ICD-10-CM | POA: Diagnosis not present

## 2023-08-12 DIAGNOSIS — E109 Type 1 diabetes mellitus without complications: Secondary | ICD-10-CM | POA: Diagnosis not present

## 2023-08-12 DIAGNOSIS — E1065 Type 1 diabetes mellitus with hyperglycemia: Secondary | ICD-10-CM | POA: Diagnosis not present

## 2023-09-22 DIAGNOSIS — F331 Major depressive disorder, recurrent, moderate: Secondary | ICD-10-CM | POA: Diagnosis not present

## 2023-10-25 DIAGNOSIS — E109 Type 1 diabetes mellitus without complications: Secondary | ICD-10-CM | POA: Diagnosis not present

## 2023-11-14 DIAGNOSIS — E109 Type 1 diabetes mellitus without complications: Secondary | ICD-10-CM | POA: Diagnosis not present

## 2023-12-27 DIAGNOSIS — F331 Major depressive disorder, recurrent, moderate: Secondary | ICD-10-CM | POA: Diagnosis not present

## 2024-02-15 ENCOUNTER — Ambulatory Visit: Admitting: Family Medicine

## 2024-02-15 ENCOUNTER — Ambulatory Visit: Payer: Self-pay

## 2024-02-15 ENCOUNTER — Encounter: Payer: Self-pay | Admitting: Family Medicine

## 2024-02-15 VITALS — BP 120/84 | HR 102 | Temp 98.5°F | Ht 66.0 in | Wt 147.2 lb

## 2024-02-15 DIAGNOSIS — E1069 Type 1 diabetes mellitus with other specified complication: Secondary | ICD-10-CM

## 2024-02-15 DIAGNOSIS — L84 Corns and callosities: Secondary | ICD-10-CM

## 2024-02-15 NOTE — Progress Notes (Signed)
 Subjective:  HPI: Jeffrey Delgado is a 23 y.o. male presenting on 02/15/2024 for Acute Visit (Toe numbness /Callus on both big toes that is numb x 2 causes pain with walking and pressure /Soaking and otc's not fully helping . Also has black spots under both main toe nails that he would like examined )   HPI Patient is in today for concerns of a painful callus to his right great toe as well as black spot under bilateral toenails. He endorses some decreased sensation in both toes. States he does walk a lot but no trauma or injury and wears well fitting shoes. Has tried soaking the toes. He does have uncontrolled DM1.  Review of Systems  All other systems reviewed and are negative.   Relevant past medical history reviewed and updated as indicated.   Past Medical History:  Diagnosis Date   ADHD (attention deficit hyperactivity disorder)    Autism spectrum disorder    Developmental delay    Diabetes mellitus without complication (HCC)    Type I   Dysgraphia    History of tympanoplasty of right ear    [facility]   Receptive-expressive language delay      Past Surgical History:  Procedure Laterality Date   ingrown toenails Bilateral    TONSILLECTOMY AND ADENOIDECTOMY     tubes in ear     and another ear surgery to fix the hole where the tubes fell out    Allergies and medications reviewed and updated.   Current Outpatient Medications:    aripiprazole  (ABILIFY ) 10 MG disintegrating tablet, Take 10 mg by mouth daily., Disp: , Rfl:    Continuous Glucose Sensor (DEXCOM G7 SENSOR) MISC, 1 each by Does not apply route daily as needed. Continous Glucose Meter, Disp: , Rfl:    FLUoxetine  (PROZAC ) 20 MG tablet, Take 40 mg by mouth daily., Disp: , Rfl:    FLUoxetine  (PROZAC ) 20 MG tablet, Take 20 mg by mouth daily., Disp: , Rfl:    hydrOXYzine  (ATARAX ) 25 MG tablet, Take 25 mg by mouth 3 (three) times daily as needed for anxiety., Disp: , Rfl:    insulin  aspart protamine- aspart  (NOVOLOG  MIX 70/30) (70-30) 100 UNIT/ML injection, Inject 70 Units into the skin daily as needed., Disp: , Rfl:    Insulin  Disposable Pump (OMNIPOD 5 DEXG7G6 INTRO GEN 5) KIT, Inject 1 Application into the skin every 14 (fourteen) days., Disp: , Rfl:    traZODone  (DESYREL ) 50 MG tablet, Take 50 mg by mouth as needed for sleep., Disp: , Rfl:   Allergies  Allergen Reactions   Penicillins Rash   Amoxicillin Rash    Did it involve swelling of the face/tongue/throat, SOB, or low BP? No Did it involve sudden or severe rash/hives, skin peeling, or any reaction on the inside of your mouth or nose? No Did you need to seek medical attention at a hospital or doctor's office? Yes When did it last happen?     pt was a baby  If all above answers are "NO", may proceed with cephalosporin use.     Objective:   BP 120/84   Pulse (!) 102   Temp 98.5 F (36.9 C)   Ht 5' 6 (1.676 m)   Wt 147 lb 3.2 oz (66.8 kg)   SpO2 98%   BMI 23.76 kg/m      02/15/2024   12:04 PM 08/02/2023   10:33 AM 01/10/2023    3:38 PM  Vitals with BMI  Height  5' 6 5' 6 5' 6  Weight 147 lbs 3 oz 137 lbs 10 oz 150 lbs 10 oz  BMI 23.77 22.22 24.32  Systolic 120 120 879  Diastolic 84 70 62  Pulse 102 104 102     Physical Exam Vitals and nursing note reviewed.  Constitutional:      Appearance: Normal appearance. He is normal weight.  HENT:     Head: Normocephalic and atraumatic.  Musculoskeletal:       Feet:  Skin:    General: Skin is warm and dry.     Capillary Refill: Capillary refill takes less than 2 seconds.  Neurological:     General: No focal deficit present.     Mental Status: He is alert and oriented to person, place, and time. Mental status is at baseline.  Psychiatric:        Mood and Affect: Mood normal.        Behavior: Behavior normal.        Thought Content: Thought content normal.        Judgment: Judgment normal.     Assessment & Plan:  Callus of toe Assessment & Plan: Callus present  at plantar aspect of his right great toe. Has tried OTC treatments. Discussed risks of uncontrolled diabetes and advised against debriding techniques. Will refer to podiatry.  Also has what is possibly onychomycosis to bilateral great toe toenails. Discussed care and management options and will defer to podiatry for this as well. Do not share toenail clippers or tools.  Orders: -     Ambulatory referral to Podiatry  Type 1 diabetes mellitus with other specified complication (HCC) -     Ambulatory referral to Podiatry     Follow up plan: Return if symptoms worsen or fail to improve.  Jeffrey GORMAN Barrio, FNP

## 2024-02-15 NOTE — Assessment & Plan Note (Signed)
 Callus present at plantar aspect of his right great toe. Has tried OTC treatments. Discussed risks of uncontrolled diabetes and advised against debriding techniques. Will refer to podiatry.  Also has what is possibly onychomycosis to bilateral great toe toenails. Discussed care and management options and will defer to podiatry for this as well. Do not share toenail clippers or tools.

## 2024-02-15 NOTE — Telephone Encounter (Signed)
 FYI Only or Action Required?: Action required by provider: request for appointment.  Patient was last seen in primary care on 08/02/2023 by Duanne Butler DASEN, MD.  Called Nurse Triage reporting Toe numbness.  Symptoms began several weeks ago.  Interventions attempted: Nothing.  Symptoms are: unchanged. Numbness to both big toes, after changing to different shoes. No swelling or redness.  Triage Disposition: See PCP When Office is Open (Within 3 Days)  Patient/caregiver understands and will follow disposition?: Yes   Copied from CRM [id number]. Topic: Clinical - Red Word Triage >> Feb 15, 2024 10:03 AM Henretta I wrote: Red Word that prompted transfer to Nurse Triage: Patient's mom Andrea Hopping called in because patient is a  Type 1 Diabetic and has been experiencing toe numbness Reason for Disposition  [1] Numbness or tingling in both feet AND [2] new or increased  Answer Assessment - Initial Assessment Questions 1. SYMPTOM: What's the main symptom you're concerned about? (e.g., rash, sore, callus, drainage, numbness)     Numbness 2. LOCATION: Where is the   located? (e.g., foot/toe, top/bottom, left/right)     Both big toes 3. APPEARANCE: What does the area look like? (e.g., normal, red, swollen; size)     no 4. ONSET: When did the    start?     2 weeks ago 5. PAIN: Is there any pain? If Yes, ask: How bad is it? (Scale: 0-10; none, mild, moderate, severe)     mild 6. CAUSE: What do you think is causing the symptoms?     unsure 7. BLOOD GLUCOSE: What is your blood glucose level?      Up and down 8. USUAL RANGE: What is your blood glucose level usually? (e.g., usual fasting morning value, usual evening value)     385 9. OTHER SYMPTOMS: Do you have any other symptoms? (e.g., fever, weakness)     no 10. PREGNANCY: Is there any chance you are pregnant? When was your last menstrual period?       N/a  Protocols used: Diabetes - Foot Problems and  Questions-A-AH

## 2024-02-22 ENCOUNTER — Ambulatory Visit: Admitting: Podiatry

## 2024-03-21 ENCOUNTER — Ambulatory Visit: Admitting: Podiatry

## 2024-03-21 VITALS — Ht 66.0 in | Wt 147.2 lb

## 2024-03-21 DIAGNOSIS — B351 Tinea unguium: Secondary | ICD-10-CM | POA: Diagnosis not present

## 2024-03-21 DIAGNOSIS — L84 Corns and callosities: Secondary | ICD-10-CM | POA: Diagnosis not present

## 2024-03-21 DIAGNOSIS — E119 Type 2 diabetes mellitus without complications: Secondary | ICD-10-CM | POA: Diagnosis not present

## 2024-03-21 NOTE — Progress Notes (Signed)
  Subjective:  Patient ID: Jeffrey Delgado, male    DOB: 07/05/2000,  MRN: [id number]  Chief Complaint  Patient presents with   Foot Problem    Rm 1 Patient is here for callus on right hallux and a dark spot on the left hallux nail.    23 y.o. male presents with the above complaint. History confirmed with patient.   Objective:  Physical Exam: warm, good capillary refill, no trophic changes or ulcerative lesions, normal DP and PT pulses, normal sensory exam, and callus present plantar medial right hallux, dystrophic right hallux nail and left hallux nail with dark discoloration and distal lateral quadrant.  Assessment:   1. Onychomycosis   2. Callus of foot   3. Diabetes mellitus without complication (HCC)      Plan:  Patient was evaluated and treated and all questions answered.  All symptomatic hyperkeratoses were safely debrided with a sterile #15 blade to patient's level of comfort without incident. We discussed preventative and palliative care of these lesions including supportive and accommodative shoegear, padding, prefabricated and custom molded accommodative orthoses, use of a pumice stone and lotions/creams daily.  Recommended urea 40% cream  Discoloration of the nail appears to be dystrophic more so than onychomycosis I recommended culture and biopsy of the nail plate and this was taken and sent to [facility].  Will notify him of the results with MyChart.  Follow-up with me as needed for these issues.  Return if symptoms worsen or fail to improve.

## 2024-03-21 NOTE — Patient Instructions (Addendum)
 Look for urea 40% cream or ointment and apply to the thickened dry skin / calluses. This can be bought over the counter, at a pharmacy or online such as [employer].

## 2024-03-21 NOTE — Addendum Note (Signed)
 Addended by: JASMINE VERNELL PARAS on: 03/21/2024 03:10 PM   Modules accepted: Orders

## 2024-03-29 ENCOUNTER — Other Ambulatory Visit: Payer: Self-pay | Admitting: Podiatry

## 2024-04-19 ENCOUNTER — Other Ambulatory Visit

## 2024-04-19 DIAGNOSIS — Z1329 Encounter for screening for other suspected endocrine disorder: Secondary | ICD-10-CM

## 2024-04-19 DIAGNOSIS — Z13220 Encounter for screening for lipoid disorders: Secondary | ICD-10-CM

## 2024-04-19 DIAGNOSIS — E1069 Type 1 diabetes mellitus with other specified complication: Secondary | ICD-10-CM

## 2024-04-19 DIAGNOSIS — Z0000 Encounter for general adult medical examination without abnormal findings: Secondary | ICD-10-CM

## 2024-04-20 ENCOUNTER — Ambulatory Visit: Payer: Self-pay | Admitting: Family Medicine

## 2024-04-20 ENCOUNTER — Other Ambulatory Visit

## 2024-04-20 LAB — CBC WITH DIFFERENTIAL/PLATELET
Absolute Lymphocytes: 1534 {cells}/uL (ref 850–3900)
Absolute Monocytes: 476 {cells}/uL (ref 200–950)
Basophils Absolute: 28 {cells}/uL (ref 0–200)
Basophils Relative: 0.5 %
Eosinophils Absolute: 140 {cells}/uL (ref 15–500)
Eosinophils Relative: 2.5 %
HCT: 47.8 % (ref 38.5–50.0)
Hemoglobin: 15.7 g/dL (ref 13.2–17.1)
MCH: 29.8 pg (ref 27.0–33.0)
MCHC: 32.8 g/dL (ref 32.0–36.0)
MCV: 90.9 fL (ref 80.0–100.0)
MPV: 11.9 fL (ref 7.5–12.5)
Monocytes Relative: 8.5 %
Neutro Abs: 3422 {cells}/uL (ref 1500–7800)
Neutrophils Relative %: 61.1 %
Platelets: 291 Thousand/uL (ref 140–400)
RBC: 5.26 Million/uL (ref 4.20–5.80)
RDW: 11.1 % (ref 11.0–15.0)
Total Lymphocyte: 27.4 %
WBC: 5.6 Thousand/uL (ref 3.8–10.8)

## 2024-04-20 LAB — COMPLETE METABOLIC PANEL WITHOUT GFR
AG Ratio: 1.4 (calc) (ref 1.0–2.5)
ALT: 25 U/L (ref 9–46)
AST: 15 U/L (ref 10–40)
Albumin: 4.4 g/dL (ref 3.6–5.1)
Alkaline phosphatase (APISO): 82 U/L (ref 36–130)
BUN: 12 mg/dL (ref 7–25)
CO2: 25 mmol/L (ref 20–32)
Calcium: 9.8 mg/dL (ref 8.6–10.3)
Chloride: 103 mmol/L (ref 98–110)
Creat: 0.66 mg/dL (ref 0.60–1.24)
Globulin: 3.1 g/dL (ref 1.9–3.7)
Glucose, Bld: 186 mg/dL — ABNORMAL HIGH (ref 65–99)
Potassium: 4.2 mmol/L (ref 3.5–5.3)
Sodium: 137 mmol/L (ref 135–146)
Total Bilirubin: 0.9 mg/dL (ref 0.2–1.2)
Total Protein: 7.5 g/dL (ref 6.1–8.1)

## 2024-04-20 LAB — TSH: TSH: 1.22 m[IU]/L (ref 0.40–4.50)

## 2024-04-20 LAB — LIPID PANEL
Cholesterol: 225 mg/dL — ABNORMAL HIGH (ref <200)
HDL: 72 mg/dL (ref >=40)
LDL Cholesterol (Calc): 131 mg/dL — ABNORMAL HIGH
Non-HDL Cholesterol (Calc): 153 mg/dL — ABNORMAL HIGH (ref <130)
Total CHOL/HDL Ratio: 3.1 (calc) (ref <5.0)
Triglycerides: 117 mg/dL (ref <150)

## 2024-04-20 LAB — PROTEIN / CREATININE RATIO, URINE
Creatinine, Urine: 137 mg/dL (ref 20–320)
Protein/Creat Ratio: 88 mg/g{creat} (ref 25–148)
Protein/Creatinine Ratio: 0.088 mg/mg{creat} (ref 0.025–0.148)
Total Protein, Urine: 12 mg/dL (ref 5–25)

## 2024-04-24 DIAGNOSIS — F331 Major depressive disorder, recurrent, moderate: Secondary | ICD-10-CM | POA: Diagnosis not present

## 2024-05-07 ENCOUNTER — Ambulatory Visit: Admitting: Family Medicine

## 2024-07-04 ENCOUNTER — Ambulatory Visit: Payer: Self-pay | Admitting: Podiatry
